# Patient Record
Sex: Female | Born: 1935
Health system: Southern US, Community
[De-identification: ages and names within clinical notes are randomized; demographics above are authoritative.]

## PROBLEM LIST (undated history)

## (undated) DIAGNOSIS — I639 Cerebral infarction, unspecified: Secondary | ICD-10-CM

## (undated) DIAGNOSIS — C541 Malignant neoplasm of endometrium: Secondary | ICD-10-CM

## (undated) DIAGNOSIS — R51 Headache: Secondary | ICD-10-CM

## (undated) DIAGNOSIS — I514 Myocarditis, unspecified: Secondary | ICD-10-CM

## (undated) DIAGNOSIS — F419 Anxiety disorder, unspecified: Secondary | ICD-10-CM

## (undated) DIAGNOSIS — E785 Hyperlipidemia, unspecified: Secondary | ICD-10-CM

## (undated) DIAGNOSIS — K219 Gastro-esophageal reflux disease without esophagitis: Secondary | ICD-10-CM

## (undated) DIAGNOSIS — I429 Cardiomyopathy, unspecified: Secondary | ICD-10-CM

## (undated) DIAGNOSIS — R519 Headache, unspecified: Secondary | ICD-10-CM

## (undated) DIAGNOSIS — J329 Chronic sinusitis, unspecified: Secondary | ICD-10-CM

## (undated) DIAGNOSIS — R002 Palpitations: Secondary | ICD-10-CM

## (undated) DIAGNOSIS — M1990 Unspecified osteoarthritis, unspecified site: Secondary | ICD-10-CM

## (undated) DIAGNOSIS — G459 Transient cerebral ischemic attack, unspecified: Secondary | ICD-10-CM

## (undated) DIAGNOSIS — I10 Essential (primary) hypertension: Secondary | ICD-10-CM

## (undated) DIAGNOSIS — Z923 Personal history of irradiation: Secondary | ICD-10-CM

## (undated) HISTORY — DX: Hyperlipidemia, unspecified: E78.5

## (undated) HISTORY — DX: Anxiety disorder, unspecified: F41.9

## (undated) HISTORY — DX: Chronic sinusitis, unspecified: J32.9

## (undated) HISTORY — DX: Myocarditis, unspecified: I51.4

## (undated) HISTORY — DX: Unspecified osteoarthritis, unspecified site: M19.90

## (undated) HISTORY — DX: Cardiomyopathy, unspecified: I42.9

## (undated) HISTORY — DX: Essential (primary) hypertension: I10

## (undated) HISTORY — DX: Palpitations: R00.2

## (undated) HISTORY — DX: Headache, unspecified: R51.9

## (undated) HISTORY — DX: Headache: R51

## (undated) HISTORY — DX: Transient cerebral ischemic attack, unspecified: G45.9

## (undated) HISTORY — DX: Gastro-esophageal reflux disease without esophagitis: K21.9

---

## 1963-12-25 HISTORY — PX: DILATION AND CURETTAGE OF UTERUS: SHX78

## 2004-12-24 HISTORY — PX: CARDIAC CATHETERIZATION: SHX172

## 2005-01-08 ENCOUNTER — Encounter: Payer: Self-pay | Admitting: Cardiology

## 2005-01-09 ENCOUNTER — Ambulatory Visit: Payer: Self-pay | Admitting: Cardiology

## 2005-01-09 ENCOUNTER — Inpatient Hospital Stay (HOSPITAL_COMMUNITY): Admission: AD | Admit: 2005-01-09 | Discharge: 2005-01-11 | Payer: Self-pay | Admitting: Cardiology

## 2005-01-10 ENCOUNTER — Encounter: Payer: Self-pay | Admitting: Cardiology

## 2005-01-18 ENCOUNTER — Ambulatory Visit: Payer: Self-pay | Admitting: Cardiology

## 2005-01-30 ENCOUNTER — Encounter: Payer: Self-pay | Admitting: Cardiology

## 2005-01-30 ENCOUNTER — Ambulatory Visit: Payer: Self-pay | Admitting: Cardiology

## 2005-01-31 ENCOUNTER — Encounter: Payer: Self-pay | Admitting: Cardiology

## 2005-02-28 ENCOUNTER — Ambulatory Visit: Payer: Self-pay | Admitting: Cardiology

## 2006-02-22 ENCOUNTER — Encounter: Payer: Self-pay | Admitting: Cardiology

## 2009-07-26 ENCOUNTER — Encounter: Payer: Self-pay | Admitting: Cardiology

## 2009-10-17 ENCOUNTER — Encounter: Payer: Self-pay | Admitting: Cardiology

## 2010-01-30 ENCOUNTER — Encounter: Payer: Self-pay | Admitting: Cardiology

## 2010-05-01 ENCOUNTER — Encounter: Payer: Self-pay | Admitting: Cardiology

## 2010-08-23 ENCOUNTER — Encounter: Payer: Self-pay | Admitting: Cardiology

## 2010-09-05 ENCOUNTER — Encounter: Payer: Self-pay | Admitting: Cardiology

## 2010-10-20 ENCOUNTER — Encounter: Payer: Self-pay | Admitting: Cardiology

## 2010-11-28 ENCOUNTER — Encounter: Payer: Self-pay | Admitting: Cardiology

## 2010-12-11 ENCOUNTER — Encounter: Payer: Self-pay | Admitting: Cardiology

## 2010-12-14 ENCOUNTER — Encounter: Payer: Self-pay | Admitting: Cardiology

## 2011-01-01 ENCOUNTER — Encounter: Payer: Self-pay | Admitting: Cardiology

## 2011-01-25 NOTE — Letter (Signed)
Summary: [facility] - OFFICE NOTE  [facility] - OFFICE NOTE   Imported By: Claudette Laws 01/02/2011 11:44:43  _____________________________________________________________________  External Attachment:    Type:   Image     Comment:   External Document

## 2011-01-25 NOTE — Letter (Signed)
Summary: [facility] - OFFICE NOTE  [facility] - OFFICE NOTE   Imported By: Claudette Laws 01/02/2011 11:44:16  _____________________________________________________________________  External Attachment:    Type:   Image     Comment:   External Document

## 2011-01-25 NOTE — Letter (Signed)
Summary: [facility] - OFFICE NOTE  [facility] - OFFICE NOTE   Imported By: Claudette Laws 01/02/2011 11:37:17  _____________________________________________________________________  External Attachment:    Type:   Image     Comment:   External Document

## 2011-01-25 NOTE — Letter (Signed)
Summary: [facility] - OFFICE NOTE  [facility] - OFFICE NOTE   Imported By: Claudette Laws 01/02/2011 11:41:25  _____________________________________________________________________  External Attachment:    Type:   Image     Comment:   External Document

## 2011-01-25 NOTE — Letter (Signed)
Summary: [facility] - OFFICE NOTE  [facility] - OFFICE NOTE   Imported By: Claudette Laws 01/02/2011 11:43:54  _____________________________________________________________________  External Attachment:    Type:   Image     Comment:   External Document

## 2011-01-25 NOTE — Letter (Signed)
Summary: [facility] - OFFICE NOTE  [facility] - OFFICE NOTE   Imported By: Claudette Laws 01/02/2011 11:39:53  _____________________________________________________________________  External Attachment:    Type:   Image     Comment:   External Document

## 2011-01-29 DIAGNOSIS — R002 Palpitations: Secondary | ICD-10-CM | POA: Insufficient documentation

## 2011-01-30 ENCOUNTER — Encounter: Payer: Self-pay | Admitting: Cardiology

## 2011-01-30 ENCOUNTER — Ambulatory Visit (INDEPENDENT_AMBULATORY_CARE_PROVIDER_SITE_OTHER): Payer: Medicare Other | Admitting: Cardiology

## 2011-01-30 ENCOUNTER — Ambulatory Visit: Payer: Self-pay | Admitting: Cardiology

## 2011-01-30 DIAGNOSIS — R9389 Abnormal findings on diagnostic imaging of other specified body structures: Secondary | ICD-10-CM | POA: Insufficient documentation

## 2011-01-30 DIAGNOSIS — E782 Mixed hyperlipidemia: Secondary | ICD-10-CM | POA: Insufficient documentation

## 2011-01-30 DIAGNOSIS — I10 Essential (primary) hypertension: Secondary | ICD-10-CM | POA: Insufficient documentation

## 2011-01-30 DIAGNOSIS — I4949 Other premature depolarization: Secondary | ICD-10-CM | POA: Insufficient documentation

## 2011-02-08 ENCOUNTER — Ambulatory Visit (HOSPITAL_COMMUNITY): Payer: Medicare Other | Attending: Internal Medicine

## 2011-02-08 DIAGNOSIS — I059 Rheumatic mitral valve disease, unspecified: Secondary | ICD-10-CM

## 2011-02-08 DIAGNOSIS — I080 Rheumatic disorders of both mitral and aortic valves: Secondary | ICD-10-CM | POA: Insufficient documentation

## 2011-02-08 DIAGNOSIS — R9389 Abnormal findings on diagnostic imaging of other specified body structures: Secondary | ICD-10-CM | POA: Insufficient documentation

## 2011-02-08 DIAGNOSIS — I079 Rheumatic tricuspid valve disease, unspecified: Secondary | ICD-10-CM | POA: Insufficient documentation

## 2011-02-08 DIAGNOSIS — I10 Essential (primary) hypertension: Secondary | ICD-10-CM | POA: Insufficient documentation

## 2011-02-08 DIAGNOSIS — E785 Hyperlipidemia, unspecified: Secondary | ICD-10-CM | POA: Insufficient documentation

## 2011-02-08 NOTE — Consult Note (Signed)
Summary: CARDIOLOGY CONSULT/ [facility]  [facility]ARDIOLOGY CONSULT/ [facility]   Imported By: Zachary George 01/29/2011 17:01:45  _____________________________________________________________________  External Attachment:    Type:   Image     Comment:   External Document

## 2011-02-08 NOTE — Consult Note (Signed)
Summary: CARDIOLOGY CONSULT/ [facility]  CARDIOLOGY CONSULT/ [facility]   Imported By: Zachary George 01/29/2011 17:01:18  _____________________________________________________________________  External Attachment:    Type:   Image     Comment:   External Document

## 2011-02-08 NOTE — Cardiovascular Report (Signed)
Summary: Cardiac Catheterization  Cardiac Catheterization   Imported By: Dorise Hiss 01/30/2011 08:28:52  _____________________________________________________________________  External Attachment:    Type:   Image     Comment:   External Document

## 2011-02-08 NOTE — Letter (Signed)
Summary: Discharge Summary  Discharge Summary   Imported By: Dorise Hiss 01/30/2011 08:31:11  _____________________________________________________________________  External Attachment:    Type:   Image     Comment:   External Document

## 2011-02-08 NOTE — Assessment & Plan Note (Signed)
Summary: NP-CARDIOMYOPATHY- JM   Visit Type:  Initial Consult Primary Provider:  Dr. Kirstie Peri   History of Present Illness: 75 year old woman presents for cardiology consultation. She was seen by our group several years ago with evaluation in 2006 demonstrating normal coronary arteries at catheterization with question of an episode of myocarditis. She also had PVCs documented around that time.  She is here to discuss a recent echocardiogram done at [facility] internal medicine, results detailedl below. This was done in the setting of palpitations. The patient's understanding is that the study revealed a "leaky valve."  Lab work from 03/05/2024shows glucose 93, BUN 9, creatinine 0.7, AST 29, ALT 16, cholesterol 152, triglycerides 119, HDL 46, LDL 82, sodium 139, potassium 4.4, TSH 1.4, hemoglobin 14.1, hematocrit 41.1, platelets 213.  Echocardiogram reported that the LV apex was not well seen with echodensity, question artifact or prominent muscle band, rule out mass per report. Unfortunately I do not have the images for review. Of note, echocardiogram also done through [facility] internal medicine back in August 2010 does not describe any LV apical abnormalities.  Cardionet monitor from December 2011, ordered by Dr. Sherryll Burger, is reported to have shown sinus rhythm with PVCs, atrial run, unfortunately no actual strips for me to review at this time.  Patient states that she walks on the treadmill "up to an hour" at a time at 2.9 miles an hour, no chest pain or unusual shortness of breath beyond NYHA class II.  Preventive Screening-Counseling & Management  Caffeine-Diet-Exercise     Does Patient Exercise: yes  Current Medications (verified): 1)  Citracal Calcium+d 600-40-500 Mg-Mg-Unit Xr24h-Tab (Calcium-Magnesium-Vitamin D) .... Take 1 Tablet By Mouth Once A Day 2)  Citracal Petites/vitamin D 200-250 Mg-Unit Tabs (Calcium Citrate-Vitamin D) .... Take 1 Tablet By Mouth Once A Day 3)  Complete  Tabs  (Multiple Vitamins-Minerals) .... Take 1 Tablet By Mouth Once A Day 4)  Bayer Aspirin 325 Mg Tabs (Aspirin) .... Take 1 Tablet By Mouth Once A Day 5)  Vitamin D3 400 Unit Tabs (Cholecalciferol) .... Take 1 Tablet By Mouth Once A Day 6)  Metoprolol Succinate 50 Mg Xr24h-Tab (Metoprolol Succinate) .... Take 1/2 Tablet By Mouth Once A Day 7)  Simvastatin 20 Mg Tabs (Simvastatin) .... Take 1 Tablet By Mouth Once A Day  Allergies (verified): 1)  ! Ace Inhibitors 2)  ! * Theodur  Comments:  Nurse/Medical Assistant: The patient's medication bottles and allergies were reviewed with the patient and were updated in the Medication and Allergy Lists.  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  Alcohol Use - no Drug Use - no Regular Exercise - yes Does Patient Exercise:  yes  Review of Systems  The patient denies anorexia, fever, chest pain, syncope, peripheral edema, prolonged cough, headaches, hemoptysis, melena, and hematochezia.  Occasional palpitations that feel like "skips" although no dizziness or syncope. Not progressive. Otherwise reviewed and negative except as outlined.  Vital Signs:  Patient profile:   75 year old  female Height:      63 inches Weight:      119 pounds BMI:     21.16 Pulse rate:   65 / minute BP sitting:   137 / 83  (left arm) Cuff size:   regular  Vitals Entered By: Carlye Grippe (January 30, 2011 10:26 AM)  Physical Exam  Additional Exam:  Thin woman in no acute distress. HEENT: Conjunctiva and lids normal, oropharynx with moist mucosa. Neck: Supple, no elevated JVP or bruits. No thyromegaly. Lungs: Clear to auscultation, nonlabored. Cardiac: Regular rate and rhythm, no S3, soft S4, no significant systolic murmur. Abdomen: Soft, nontender, bowel sounds present. Skin: Warm and dry. Musculoskeletal: No kyphosis. Extremities: No pitting edema. Pulses full. Neuropsychiatric: Alert and oriented x3, affect appropriate.    Carotid Doppler  Procedure date:  10/17/2009  Findings:      1-50% stenosis in the bilateral internal carotid arteries.  Echocardiogram  Procedure date:  12/11/2010  Findings:      [facility] internal medicine - [facility]  Normal LV chamber size with moderate LVH, apex not well visualized - echodensity noted, rule out prominent muscle banding, artifact, or mass. LVEF 60-70% with diastolic dysfunction. Mildly thickened mitral leaflets with mild mitral regurgitation, mild tricuspid regurgitation.  Cardiac Cath  Procedure date:  01/10/2005  Findings:      FINDINGS:  1.  LV: 145/11/21. EF 56% with focal akinesis of the mid inferior wall.  2.  No aortic stenosis or mitral regurgitation.  3.  Left main: Angiographically normal.  4.  LAD: Moderate sized vessel. It gives rise to a large septal perforator      which parallels the LAD itself throughout the mid portion of the vessel.      It is angiographically normal.  5.  Circumflex: Relatively small vessel giving rise to a single obtuse      marginal. It is angiographically normal.  6.  RCA: Large, dominant vessel. It is angiographically normal.  EKG  Procedure date:  01/30/2011  Findings:       Sinus rhythm at 68 beats per minute.  Impression & Recommendations:  Problem # 1:  ECHOCARDIOGRAM, ABNORMAL (ICD-793.2)  We reviewed the results of the patient's recent study, although I do not have the actual images. Recent echocardiogram done through [facility]  internal medicine reports difficult visualization of the LV apex with possibility of artifact versus prominent muscle band versus mass. LVEF is however normal, and previous studies did not describe any major abnormality. Patient is very concerned about this. Our plan is to proceed with a Definity contrast echocardiogram through the [place] office. If this study is normal, it is likely that artifact was noted, and no additional testing will be arranged. If on the other hand, we can always consider a cardiac MRI if contrast echocardiogram remains concerning. We also discussed the mild mitral and tricuspid regurgitation noted, likely of no major clinical significance at this time.  Problem # 2:  OTHER PREMATURE BEATS (ICD-427.69)  Documentation of PVCs in the past, and also by recent [facility]. Likely corresponds to palpitations. No history of dizziness or syncope. No clearly documented history of obstructive ischemic heart disease based on prior assessment.  Her updated medication list for this problem includes:    Bayer Aspirin 325 Mg Tabs (Aspirin) .Marland Kitchen... Take 1  tablet by mouth once a day    Metoprolol Succinate 50 Mg Xr24h-tab (Metoprolol succinate) .Marland Kitchen... Take 1/2 tablet by mouth once a day  Orders: 2-D Echocardiogram (2D Echo)  Problem # 3:  ESSENTIAL HYPERTENSION, BENIGN (ICD-401.1)  Followed by Dr. Sherryll Burger.  Her updated medication list for this problem includes:    Bayer Aspirin 325 Mg Tabs (Aspirin) .Marland Kitchen... Take 1 tablet by mouth once a day    Metoprolol Succinate 50 Mg Xr24h-tab (Metoprolol succinate) .Marland Kitchen... Take 1/2 tablet by mouth once a day  Problem # 4:  MIXED HYPERLIPIDEMIA (ICD-272.2)  Followed by Dr. Sherryll Burger. Suggest  aggressive LDL control in light of previously documented carotid atherosclerosis.  Her updated medication list for this problem includes:    Simvastatin 20 Mg Tabs (Simvastatin) .Marland Kitchen... Take 1 tablet by mouth once a day  Other Orders: EKG w/ Interpretation (93000)  Patient Instructions: 1)  Definity Contrast Echo in [place] 2)  Follow up - will call results if abnormal.

## 2011-02-09 ENCOUNTER — Encounter (INDEPENDENT_AMBULATORY_CARE_PROVIDER_SITE_OTHER): Payer: Self-pay | Admitting: *Deleted

## 2011-02-14 NOTE — Letter (Signed)
Summary: Engineer, materials at [facility]  [address]   [place], [place] [address]   Phone: [phone]  Fax: [phone]        February 09, 2011 MRN: [id number]    Pamela Bright [address] [place], [place]  [address]    Dear [name],  Your test ordered by Selena Batten has been reviewed by your physician (or physician assistant) and was found to be normal or stable. Your physician (or physician assistant) felt no changes were needed at this time.  _X___ Echocardiogram  ____ Cardiac Stress Test  ____ Lab Work  ____ Peripheral vascular study of arms, legs or neck  ____ CT scan or X-ray  ____ Lung or Breathing test  ____ Other:   Thank you.   Cyril Loosen, RN, BSN    Duane Boston, M.D., F.A.C.C. Thressa Sheller, M.D., F.A.C.C. Oneal Grout, M.D., F.A.C.C. Cheree Ditto, M.D., F.A.C.C. Daiva Nakayama, M.D., F.A.C.C. Kenney Houseman, M.D., F.A.C.C. Jeanne Ivan, PA-C

## 2011-03-22 ENCOUNTER — Telehealth: Payer: Self-pay | Admitting: *Deleted

## 2011-03-22 NOTE — Telephone Encounter (Signed)
Pt left message on voicemail stating she had an echo on 2/16 in [place]. She states they found something (unsure what pt was talking about). She wants to know if she should make any lifestyle changes.  Echo was reviewed by Dr. Diona Browner and pt was notified by letter that echo "looks good."   Left message for pt to call back on voicemail.

## 2011-03-22 NOTE — Telephone Encounter (Signed)
Pt states Dr. Sherryll Burger told her she has an aneurysm. She states she doesn't know what she should do as far as her normal activities. Pt notified Echo per Dr. Diona Browner "Looks good. There is prominent trabeculation at LV apex - essentially a normal variant. No definite mass or thrombus." Pt should discuss further w/Dr. Sherryll Burger as our report does not say anything about aneurysm. Pt verbalized understanding.

## 2012-07-17 ENCOUNTER — Encounter: Payer: Self-pay | Admitting: Cardiology

## 2013-10-24 HISTORY — PX: TUMOR REMOVAL: SHX12

## 2013-11-26 HISTORY — PX: TOTAL ABDOMINAL HYSTERECTOMY W/ BILATERAL SALPINGOOPHORECTOMY: SHX83

## 2013-11-26 HISTORY — PX: LYMPH NODE DISSECTION: SHX5087

## 2014-01-22 ENCOUNTER — Encounter: Payer: Self-pay | Admitting: Radiation Oncology

## 2014-01-22 NOTE — Progress Notes (Signed)
GYN Location of Tumor / Histology: endometrial adenocarcinoma  Patient presented with symptoms of: vaginal bleeding since 09/2013.  Biopsies revealed:      Past/Anticipated interventions by Gyn/Onc surgery, if GYJ:EHUDJ ABDOMINAL HYSTERECTOMY W/ BILATERAL SALPINGOOPHORECTOMY and lymph node dissection on 11/26/13 by Dr. Barrie Dunker   Past/Anticipated interventions by medical oncology, if any:   Weight changes, if any: weight stable  Bowel/Bladder complaints, if any: completed macrobid on Wednesday for UTI, reports occasional brown discharge, reports vaginal spotting for two weeks following hysterectomy but, denies any since, reports occasional brown discharge, denies vaginal itching.  Nausea/Vomiting, if any: denies  Pain issues, if any:  denies  SAFETY ISSUES:  Prior radiation? no  Pacemaker/ICD? no  Possible current pregnancy? no  Is the patient on methotrexate? no  Current Complaints / other details:  Denies dysuria or hematuria, denies night sweats, reports nocturia x4, denies constipation; here today for consultation with her son.

## 2014-01-25 ENCOUNTER — Telehealth: Payer: Self-pay | Admitting: *Deleted

## 2014-01-25 ENCOUNTER — Encounter: Payer: Self-pay | Admitting: Radiation Oncology

## 2014-01-25 ENCOUNTER — Ambulatory Visit
Admission: RE | Admit: 2014-01-25 | Discharge: 2014-01-25 | Disposition: A | Discharge status: Home / Self Care | Payer: Medicare Other | Source: Ambulatory Visit | Attending: Radiation Oncology | Admitting: Radiation Oncology

## 2014-01-25 VITALS — BP 137/47 | HR 69 | Temp 98.0°F | Resp 16 | Ht 63.0 in | Wt 120.5 lb

## 2014-01-25 DIAGNOSIS — Z9079 Acquired absence of other genital organ(s): Secondary | ICD-10-CM | POA: Insufficient documentation

## 2014-01-25 DIAGNOSIS — Z90710 Acquired absence of both cervix and uterus: Secondary | ICD-10-CM | POA: Insufficient documentation

## 2014-01-25 DIAGNOSIS — Z79899 Other long term (current) drug therapy: Secondary | ICD-10-CM | POA: Insufficient documentation

## 2014-01-25 DIAGNOSIS — E785 Hyperlipidemia, unspecified: Secondary | ICD-10-CM | POA: Insufficient documentation

## 2014-01-25 DIAGNOSIS — C541 Malignant neoplasm of endometrium: Secondary | ICD-10-CM

## 2014-01-25 DIAGNOSIS — Z7982 Long term (current) use of aspirin: Secondary | ICD-10-CM | POA: Insufficient documentation

## 2014-01-25 DIAGNOSIS — I428 Other cardiomyopathies: Secondary | ICD-10-CM | POA: Insufficient documentation

## 2014-01-25 DIAGNOSIS — I10 Essential (primary) hypertension: Secondary | ICD-10-CM | POA: Insufficient documentation

## 2014-01-25 DIAGNOSIS — Z8673 Personal history of transient ischemic attack (TIA), and cerebral infarction without residual deficits: Secondary | ICD-10-CM | POA: Insufficient documentation

## 2014-01-25 DIAGNOSIS — K219 Gastro-esophageal reflux disease without esophagitis: Secondary | ICD-10-CM | POA: Insufficient documentation

## 2014-01-25 DIAGNOSIS — C549 Malignant neoplasm of corpus uteri, unspecified: Secondary | ICD-10-CM | POA: Insufficient documentation

## 2014-01-25 HISTORY — DX: Malignant neoplasm of endometrium: C54.1

## 2014-01-25 HISTORY — DX: Cerebral infarction, unspecified: I63.9

## 2014-01-25 NOTE — Progress Notes (Signed)
Radiation Oncology         [phone] ________________________________  Initial outpatient Consultation  Name: LUANNA WEESNER MRN: [id number]  Date: 01/25/2014  DOB: Apr 01, 1936  BP:ZWCH,ENIDPO, MD  Gatha Mayer, MD Santo Held, MD  REFERRING PHYSICIAN: Gatha Mayer, MD  DIAGNOSIS: Endometrioid adenocarcinoma,  Stage pT1b, pN0, Mx    HISTORY OF PRESENT ILLNESS::Kammi D Woodford is a 78 y.o. female who is seen out of the courtesy of Dr. Orlene Erm for an opinion concerning radiation therapy as part of management of patient's stage I endometrioid adenocarcinoma. The patient presented late last year with vaginal bleeding. She was taken to  the operating room by Dr. Barrie Dunker and underwent total hysterectomy with bilateral salpingo-oophorectomy along with node dissection. Pathology was significant for endometrioid adenocarcinoma, grade 2 measuring 3 cm in greatest dimension. The depth of invasion was 10 mm with a total myometrial thickness of 13 mm. No LVI or cervical involvement was noted. Patient is done well since her surgery. She was seen by Dr. Orlene Erm in [place] and based on her stage it was recommended consideration for intracavitary brachytherapy treatments. Patient now presents for evaluation of  this treatment.Marland Kitchen  PREVIOUS RADIATION THERAPY: No  PAST MEDICAL HISTORY:  has a past medical history of GERD (gastroesophageal reflux disease); Cardiomyopathy; Generalized headaches; Palpitations; TIA (transient ischemic attack); Sinusitis; Anxiety; Hyperlipidemia; Hypertension; Myocarditis; CVA (cerebral infarction); Endometrial cancer; and Osteoarthritis.    PAST SURGICAL HISTORY: Past Surgical History  Procedure Laterality Date  . Total abdominal hysterectomy w/ bilateral salpingoophorectomy  11/26/13    by Dr. Barrie Dunker  . Lymph node dissection  11/26/13  . Cardiac catheterization  12/2004  . Dilation and curettage of uterus  1965    removal of polyps  . Tumor removal  10/2013    FAMILY  HISTORY: family history includes Cancer in her maternal aunt, maternal uncle, maternal uncle, mother, and another family member; Heart disease in her father and another family member.  SOCIAL HISTORY:  reports that she has never smoked. She does not have any smokeless tobacco history on file. She reports that she does not drink alcohol. she lives in [place], [place].  ALLERGIES: Ace inhibitors and Theophyllines  MEDICATIONS:  Current Outpatient Prescriptions  Medication Sig Dispense Refill  . aspirin 325 MG tablet Take 325 mg by mouth daily.      . Calcium Citrate (CITRACAL PO) Take by mouth.      . Cholecalciferol (VITAMIN D-3 PO) Take by mouth.      . metoprolol succinate (TOPROL-XL) 50 MG 24 hr tablet Take 25 mg by mouth daily. Take with or immediately following a meal.      . Multiple Vitamins-Minerals (MULTIVITAMIN PO) Take by mouth.      . Omega-3 Fatty Acids (FISH OIL BURP-LESS) 1200 MG CAPS Take by mouth.      . simvastatin (ZOCOR) 20 MG tablet Take 20 mg by mouth every evening.       No current facility-administered medications for this encounter.    REVIEW OF SYSTEMS:  A 15 point review of systems is documented in the electronic medical record. This was obtained by the nursing staff. However, I reviewed this with the patient to discuss relevant findings and make appropriate changes.  She denies any pelvic pain or urination difficulties. She has had some mild problems with constipation. She denies any flank pain or breathing problems.   PHYSICAL EXAM:  height is 5\' 3"  (1.6 m) and weight is 120 lb 8 oz (54.658 kg). Her oral  temperature is 98 F (36.7 C). Her blood pressure is 137/47 and her pulse is 69. Her respiration is 16 and oxygen saturation is 100%.   BP 137/47  Pulse 69  Temp(Src) 98 F (36.7 C) (Oral)  Resp 16  Ht 5\' 3"  (1.6 m)  Wt 120 lb 8 oz (54.658 kg)  BMI 21.35 kg/m2  SpO2 100%  General Appearance:    Alert, cooperative, no distress, appears stated age,  accompanied by her son on evaluation today   Head:    Normocephalic, without obvious abnormality, atraumatic  Eyes:    PERRL, conjunctiva/corneas clear, EOM's intact,      Ears:    Normal TM's and external ear canals, both ears  Nose:   Nares normal, septum midline, mucosa normal, no drainage    or sinus tenderness  Throat:   Lips, mucosa, and tongue normal; gums normal  Neck:   Supple, symmetrical, trachea midline, no adenopathy;    thyroid:  no enlargement/tenderness/nodules; no carotid   bruit or JVD  Back:     Symmetric, no curvature, ROM normal, no CVA tenderness  Lungs:     Clear to auscultation bilaterally, respirations unlabored  Chest Wall:    No tenderness or deformity   Heart:    Regular rate and rhythm, S1 and S2 normal, no murmur, rub   or gallop     Abdomen:     Soft, non-tender, bowel sounds active all four quadrants,    no masses, no organomegaly  Genitalia:    deferred until planning day      Extremities:   Extremities normal, atraumatic, no cyanosis or edema  Pulses:   2+ and symmetric all extremities  Skin:   Skin color, texture, turgor normal, no rashes or lesions  Lymph nodes:   Cervical, supraclavicular, and axillary nodes normal  Neurologic:    normal strength, sensation and reflexes    throughout    ECOG = 0   LABORATORY DATA:  No results found for this basename: WBC, HGB, HCT, MCV, PLT   No results found for this basename: NA, K, CL, CO2   No results found for this basename: ALT, AST, GGT, ALKPHOS, BILITOT     RADIOGRAPHY: No results found.    IMPRESSION: Endometrioid adenocarcinoma,  Stage pT1b, pN0, Mx. Patient would be a good candidate for intracavitary brachytherapy treatments to reduce the chances for vaginal cuff recurrence. I discussed the treatment course,  side effects and potential toxicities of radiation therapy in this situation with the patient and her son. She appears to understand and wishes to proceed with planned course of treatment. I  did discuss with the patient that given her age and clinical stage another option would be external beam radiation therapy but at this time the patient would prefer to proceed with intracavitary brachytherapy treatments.    PLAN: Simulation and planning in the near future. I anticipate 5 high-dose rate treatments directed at the vaginal cuff area using iridium 192 as the high-dose-rate source.  I spent 40 minutes minutes face to face with the patient and more than 50% of that time was spent in counseling and/or coordination of care.   ------------------------------------------------  -----------------------------------  Blair Promise, PhD, MD

## 2014-01-25 NOTE — Telephone Encounter (Signed)
CALLED PATIENT TO INFORM OF APPTS. FOR HDR CASE, LVM FOR A RETURN CALL

## 2014-01-25 NOTE — Progress Notes (Signed)
See progress note under physician encounter. 

## 2014-01-27 NOTE — Progress Notes (Signed)
GYN Location of Tumor / Histology: endometrial adenocarcinoma   Patient presented with symptoms of: vaginal bleeding since 09/2013.   Biopsies revealed:    Past/Anticipated interventions by Gyn/Onc surgery, if OVA:NVBTY ABDOMINAL HYSTERECTOMY W/ BILATERAL SALPINGOOPHORECTOMY and lymph node dissection on 11/26/13 by Dr. Barrie Dunker   Past/Anticipated interventions by medical oncology, if any: none  Weight changes, if any: weight stable   Bowel/Bladder complaints, if any: completed macrobid on Wednesday for UTI, reports occasional brown discharge, reports vaginal spotting for two weeks following hysterectomy but, denies any since, reports occasional brown discharge, denies vaginal itching.   Nausea/Vomiting, if any: denies   Pain issues, if any: denies   SAFETY ISSUES:  Prior radiation? no  Pacemaker/ICD? no  Possible current pregnancy? no  Is the patient on methotrexate? No  Current Complaints / other details:

## 2014-01-29 ENCOUNTER — Telehealth: Payer: Self-pay | Admitting: *Deleted

## 2014-01-29 NOTE — Telephone Encounter (Signed)
CALLED PATIENT TO REMIND OF APPTS. FOR 02-01-14, SPOKE WITH PATIENT AND SHE IS AWARE OF THESE APPTS.

## 2014-02-01 ENCOUNTER — Ambulatory Visit
Admission: RE | Admit: 2014-02-01 | Discharge: 2014-02-01 | Disposition: A | Discharge status: Home / Self Care | Payer: Medicare Other | Source: Ambulatory Visit | Attending: Radiation Oncology | Admitting: Radiation Oncology

## 2014-02-01 ENCOUNTER — Encounter: Payer: Self-pay | Admitting: Radiation Oncology

## 2014-02-01 VITALS — BP 147/78 | HR 68 | Temp 97.8°F | Ht 63.0 in | Wt 120.1 lb

## 2014-02-01 DIAGNOSIS — Z9079 Acquired absence of other genital organ(s): Secondary | ICD-10-CM | POA: Insufficient documentation

## 2014-02-01 DIAGNOSIS — Z8673 Personal history of transient ischemic attack (TIA), and cerebral infarction without residual deficits: Secondary | ICD-10-CM | POA: Insufficient documentation

## 2014-02-01 DIAGNOSIS — Z87440 Personal history of urinary (tract) infections: Secondary | ICD-10-CM | POA: Insufficient documentation

## 2014-02-01 DIAGNOSIS — Z79899 Other long term (current) drug therapy: Secondary | ICD-10-CM | POA: Insufficient documentation

## 2014-02-01 DIAGNOSIS — C549 Malignant neoplasm of corpus uteri, unspecified: Secondary | ICD-10-CM

## 2014-02-01 DIAGNOSIS — E785 Hyperlipidemia, unspecified: Secondary | ICD-10-CM | POA: Insufficient documentation

## 2014-02-01 DIAGNOSIS — I10 Essential (primary) hypertension: Secondary | ICD-10-CM | POA: Insufficient documentation

## 2014-02-01 DIAGNOSIS — Z809 Family history of malignant neoplasm, unspecified: Secondary | ICD-10-CM | POA: Insufficient documentation

## 2014-02-01 DIAGNOSIS — Z90710 Acquired absence of both cervix and uterus: Secondary | ICD-10-CM | POA: Insufficient documentation

## 2014-02-01 DIAGNOSIS — Z7982 Long term (current) use of aspirin: Secondary | ICD-10-CM | POA: Insufficient documentation

## 2014-02-01 DIAGNOSIS — K5900 Constipation, unspecified: Secondary | ICD-10-CM | POA: Insufficient documentation

## 2014-02-01 NOTE — Progress Notes (Signed)
   Department of Radiation Oncology  Phone:  [phone] Fax:        [phone]  Simulation note  Patient was taken to the CT simulation suite. Her previously constructed vaginal cylinder was placed in the proximal vagina. This was affixed to the CT/MR stabilization plate to prevent slippage. The patient then proceeded to undergo CT scan in the treatment position. The vaginal cylinder was noted to be in good position. Patient will undergo planning for her first high-dose-rate treatment. Point doses to the bladder and rectum will be obtained as well as to the cylinder surface.  Treatment plan  Patient will receive 5 treatments directed at the proximal vagina. Prescription will be 6 gray to the mucosal surface. Treatment length will be 3 cm.  5 treatments will be delivered with a cumulative brachytherapy dose of 30 gray.  Patient will be treated with iridium 192 as the high-dose-rate source.  -----------------------------------  Blair Promise, PhD, MD

## 2014-02-01 NOTE — Progress Notes (Signed)
   Department of Radiation Oncology  Phone:  [phone] Fax:        [phone]  Vaginal brachytherapy procedure note  The patient was taken to the nurse's station and placed in the dorsolithotomy position. A pelvic exam was performed which showed the vaginal cuff to be well healed. A scant amount of blood was noted at the vaginal cuff area.  The vaginal vault was somewhat narrowed but would permit a small speculum. Patient then underwent fitting for her vaginal cylinder. The optimal diameter for the patient's anatomy was a 2.5 cm diameter cylinder. This distended the vaginal vault without any significant air caps and did not cause any pain for the patient.  She noted some mild pressure with the insertion.  The patient tolerated the procedure well. She will be transferred to the planning room for a CT scan through the pelvis with the cylinder in place.   -----------------------------------  Blair Promise, PhD, MD      Simple treatment device note  The patient had construction of her custom vaginal cylinder today. The patient will be treated with two 2.5 cm diameter cylinders  placed proximally within the vagina. More distal the patient had three 2.0 cm cylinders placed.  -----------------------------------  Blair Promise, PhD, MD

## 2014-02-02 NOTE — Progress Notes (Signed)
  Radiation Oncology         [phone] ________________________________  Name: Pamela Bright MRN: [id number]  Date: 02/01/2014  DOB: 11-Feb-1936  Simulation Verification Note  Status: outpatient  NARRATIVE: The patient was brought to the HDR treatment unit and placed in the planned treatment position. The clinical setup was verified. Then port films were obtained (AP and Lateral) and uploaded to the radiation oncology medical record software.  The treatment beams were carefully compared against the planned radiation fields. The position location and shape of the radiation fields was reviewed.   Based on my personal review, I approved the simulation verification. The patient's treatment will proceed as planned.  -----------------------------------  Blair Promise, PhD, MD

## 2014-02-02 NOTE — Progress Notes (Signed)
   Department of Radiation Oncology  Phone:  [phone] Fax:        [phone]  High-dose-rate brachytherapy procedure note  After accurate placement of the vaginal cylinder was verified,  the remote afterloading device was affixed to the vaginal cylinder through catheter system. Patient then proceeded to undergo her first high-dose-rate treatment directed at the proximal vagina. Patient was treated with 1 channel using multiple dwell positions. Total treatment time was 196.3 seconds. Patient tolerated the procedure well. After completion of her therapy a radiation survey was performed documenting return of the iridium source into the Nucletron safe period  -----------------------------------  Blair Promise, PhD, MD

## 2014-02-05 ENCOUNTER — Telehealth: Payer: Self-pay | Admitting: *Deleted

## 2014-02-05 NOTE — Telephone Encounter (Signed)
CALLED PATIENT TO REMIND OF HDR [place] 02-08-14, SPOKE WITH PATIENT AND SHE IS AWARE OF THIS APPT.

## 2014-02-05 NOTE — Telephone Encounter (Signed)
xxxx 

## 2014-02-08 ENCOUNTER — Encounter: Payer: Self-pay | Admitting: Radiation Oncology

## 2014-02-08 ENCOUNTER — Ambulatory Visit
Admission: RE | Admit: 2014-02-08 | Discharge: 2014-02-08 | Disposition: A | Discharge status: Home / Self Care | Payer: Medicare Other | Source: Ambulatory Visit | Attending: Radiation Oncology | Admitting: Radiation Oncology

## 2014-02-08 DIAGNOSIS — C549 Malignant neoplasm of corpus uteri, unspecified: Secondary | ICD-10-CM

## 2014-02-08 NOTE — Progress Notes (Signed)
  Department of Radiation Oncology  Phone: [phone]  Fax: [phone]   High-dose-rate brachytherapy procedure note   After accurate placement of the vaginal cylinder was verified, the remote afterloading device was affixed to the vaginal cylinder through catheter system. Patient then proceeded to undergo her second high-dose-rate treatment directed at the proximal vagina. Patient was treated with 1 channel using multiple dwell positions. Total treatment time was 209.6 seconds. Patient tolerated the procedure well. After completion of her therapy a radiation survey was performed documenting return of the iridium source into the Nucletron safe period  -----------------------------------   Blair Promise, PhD, MD

## 2014-02-08 NOTE — Progress Notes (Signed)
  Department of Radiation Oncology  Phone: [phone]  Fax: [phone]    Vaginal brachytherapy procedure note   The patient was taken to the HDR suite and placed in the dorsolithotomy position. A pelvic exam was performed which showed the vaginal cuff to be well healed. The patient proceeded to have placement of her custom vaginal cylinder. The cylinder was then affixed to the CT/MR stabilization plate to prevent slippage. Patient tolerated the procedure well. -----------------------------------   Blair Promise, PhD, MD     Simple treatment device note   The patient had construction of her custom vaginal cylinder today. The patient will be treated with two 2.5 cm diameter cylinders placed proximally within the vagina. More distal the patient had three 2.0 cm cylinders placed.  -----------------------------------   Blair Promise, PhD, MD

## 2014-02-08 NOTE — Progress Notes (Signed)
  Radiation Oncology [phone] ________________________________   Name: Pamela Bright MRN: [id number]    Date: 02/08/2014  DOB: 1936-10-18   Simulation Verification Note   Status: outpatient   NARRATIVE: The patient was brought to the HDR treatment unit and placed in the planned treatment position. The clinical setup was verified. Then port films were obtained (AP and Lateral) and uploaded to the radiation oncology medical record software. The treatment beams were carefully compared against the planned radiation fields. The position location and shape of the radiation fields was reviewed. Based on my personal review, I approved the simulation verification. The patient's treatment will proceed as planned.  -----------------------------------   Blair Promise, PhD, MD

## 2014-02-12 ENCOUNTER — Telehealth: Payer: Self-pay | Admitting: Oncology

## 2014-02-12 ENCOUNTER — Telehealth: Payer: Self-pay | Admitting: *Deleted

## 2014-02-12 NOTE — Telephone Encounter (Signed)
Called patient to remind of HDR Treatment on Monday , Feb. 23, spoke with patient and she is aware of this appt.

## 2014-02-12 NOTE — Telephone Encounter (Signed)
Pamela Bright called and asked if she can take antibiotics for a sinus infection while having HDR treatment.  Advised her that it is OK.

## 2014-02-15 ENCOUNTER — Ambulatory Visit
Admission: RE | Admit: 2014-02-15 | Discharge: 2014-02-15 | Disposition: A | Discharge status: Home / Self Care | Payer: Medicare Other | Source: Ambulatory Visit | Attending: Radiation Oncology | Admitting: Radiation Oncology

## 2014-02-15 DIAGNOSIS — C549 Malignant neoplasm of corpus uteri, unspecified: Secondary | ICD-10-CM

## 2014-02-15 NOTE — Progress Notes (Signed)
  Department of Radiation Oncology   Phone: [phone]  Fax: [phone]    High-dose-rate brachytherapy procedure note   After accurate placement of the vaginal cylinder was verified, the remote afterloading device was affixed to the vaginal cylinder through catheter system. Patient then proceeded to undergo her third high-dose-rate treatment directed at the proximal vagina. Patient was treated with 1 channel using multiple dwell positions. Total treatment time was 224 seconds. Patient tolerated the procedure well. After completion of her therapy a radiation survey was performed documenting return of the iridium source into the Nucletron safe period  -----------------------------------   Blair Promise, PhD, MD

## 2014-02-15 NOTE — Progress Notes (Signed)
  Department of Radiation Oncology  Phone: [phone]  Fax: [phone]    Vaginal brachytherapy procedure note   The patient was taken to the HDR suite and placed in the dorsolithotomy position. A pelvic exam was performed which showed the vaginal cuff to be well healed. The patient proceeded to have placement of her custom vaginal cylinder. The cylinder was then affixed to the CT/MR stabilization plate to prevent slippage. Patient tolerated the procedure well. -----------------------------------   Janiece Scovill D. Ed Rayson, PhD, MD     Simple treatment device note   The patient had construction of her custom vaginal cylinder today. The patient will be treated with two 2.5 cm diameter cylinders placed proximally within the vagina. More distal the patient had three 2.0 cm cylinders placed.  -----------------------------------   Lakyra Tippins D. Jayanth Szczesniak, PhD, MD       

## 2014-02-15 NOTE — Progress Notes (Signed)
Simulation Verification Note  Status: outpatient   NARRATIVE: The patient was brought to the HDR treatment unit and placed in the planned treatment position. The clinical setup was verified. Then port films were obtained (AP and Lateral) and uploaded to the radiation oncology medical record software. The treatment beams were carefully compared against the planned radiation fields. The position location and shape of the radiation fields was reviewed. Based on my personal review, I approved the simulation verification. The patient's treatment will proceed as planned.  -----------------------------------   Blair Promise, PhD, MD

## 2014-02-22 ENCOUNTER — Telehealth: Payer: Self-pay | Admitting: *Deleted

## 2014-02-22 NOTE — Telephone Encounter (Signed)
CALLED PATIENT TO REMIND OF HDR [place] 02-23-14 @ 10 AM, LVM FOR A RETURN CALL

## 2014-02-23 ENCOUNTER — Ambulatory Visit
Admission: RE | Admit: 2014-02-23 | Discharge: 2014-02-23 | Disposition: A | Discharge status: Home / Self Care | Payer: Medicare Other | Source: Ambulatory Visit | Attending: Radiation Oncology | Admitting: Radiation Oncology

## 2014-02-23 DIAGNOSIS — C549 Malignant neoplasm of corpus uteri, unspecified: Secondary | ICD-10-CM

## 2014-02-23 NOTE — Progress Notes (Signed)
Simulation Verification Note   Status: outpatient   NARRATIVE: The patient was brought to the HDR treatment unit and placed in the planned treatment position. The clinical setup was verified. Then port films were obtained (AP and Lateral) and uploaded to the radiation oncology medical record software. The treatment beams were carefully compared against the planned radiation fields. The position location and shape of the radiation fields was reviewed. Based on my personal review, I approved the simulation verification. The patient's treatment will proceed as planned.  -----------------------------------   Blair Promise, PhD, MD    High-dose-rate brachytherapy procedure note    After accurate placement of the vaginal cylinder was verified, the remote afterloading device was affixed to the vaginal cylinder through catheter system. Patient then proceeded to undergo her fourth high-dose-rate treatment directed at the proximal vagina. Patient was treated with 1 channel using multiple dwell positions. Total treatment time was 241.3 seconds. Patient tolerated the procedure well. After completion of her therapy a radiation survey was performed documenting return of the iridium source into the Nucletron safe period  -----------------------------------   Blair Promise, PhD, MD

## 2014-03-01 ENCOUNTER — Telehealth: Payer: Self-pay | Admitting: *Deleted

## 2014-03-01 NOTE — Telephone Encounter (Signed)
CALLED PATIENT TO REMIND OF HDR [place]. FOR 03-02-14, LVM FOR A RETURN CALL

## 2014-03-02 ENCOUNTER — Ambulatory Visit
Admission: RE | Admit: 2014-03-02 | Discharge: 2014-03-02 | Disposition: A | Discharge status: Home / Self Care | Payer: Medicare Other | Source: Ambulatory Visit | Attending: Radiation Oncology | Admitting: Radiation Oncology

## 2014-03-02 DIAGNOSIS — C549 Malignant neoplasm of corpus uteri, unspecified: Secondary | ICD-10-CM

## 2014-03-02 NOTE — Progress Notes (Signed)
Simulation Verification Note   Status: outpatient   NARRATIVE: The patient was brought to the HDR treatment unit and placed in the planned treatment position. The clinical setup was verified. Then port films were obtained (AP and Lateral) and uploaded to the radiation oncology medical record software. The treatment beams were carefully compared against the planned radiation fields. The position location and shape of the radiation fields was reviewed. Based on my personal review, I approved the simulation verification. The patient's treatment will proceed as planned.  -----------------------------------   Blair Promise, PhD, MD   High-dose-rate brachytherapy procedure note   After accurate placement of the vaginal cylinder was verified, the remote afterloading device was affixed to the vaginal cylinder through catheter system. Patient then proceeded to undergo her fifth high-dose-rate treatment directed at the proximal vagina. Patient was treated with 1 channel using multiple dwell positions. Total treatment time was 257.8 seconds. Patient tolerated the procedure well. After completion of her therapy a radiation survey was performed documenting return of the iridium source into the Nucletron safe period  -----------------------------------   Blair Promise, PhD, MD

## 2014-03-03 ENCOUNTER — Telehealth: Payer: Self-pay | Admitting: *Deleted

## 2014-03-03 NOTE — Telephone Encounter (Signed)
Called patient to inform of fu appt. With Dr. Sondra Come in [place] on 04-05-14 @ 12:30 pm, spoke with patient and she is aware of this appt.

## 2014-04-08 ENCOUNTER — Telehealth: Payer: Self-pay | Admitting: Oncology

## 2014-04-08 ENCOUNTER — Telehealth: Payer: Self-pay | Admitting: *Deleted

## 2014-04-08 NOTE — Telephone Encounter (Signed)
Called patient to inform of fu appt. On 04-12-14 @ 9:30 am in [place] spoke with patient and she is aware of this appt.

## 2014-04-08 NOTE — Telephone Encounter (Signed)
Pamela Bright called and said she has started to use her size medium dilator.  She reports that Monday after she used it, she saw a little blood.  Yesterday when she used it she saw some blood and then this morning she noticed that the toilet water was red.  She said she has only seen pink discharge since.  Advised Dr. Sondra Come and she will be set up for a follow up appointment on Monday in [place].

## 2014-04-17 ENCOUNTER — Encounter: Payer: Self-pay | Admitting: Radiation Oncology

## 2014-04-17 NOTE — Progress Notes (Signed)
  Radiation Oncology         [phone] ________________________________  Name: ERISHA PAUGH MRN: [id number]  Date: 04/17/2014  DOB: Jan 07, 1936  End of Treatment Note  Diagnosis:     Endometrioid adenocarcinoma, Stage pT1b, pN0, Mx    Indication for treatment:  Postop to reduce the chances of vaginal recurrence       Radiation treatment dates:   February 9, February 16, February 23, March 3, March 10  Site/dose:   Proximal vagina, 3 CM treatment length, 30 gray in 5 fractions, 6 gray per fraction with prescription to the vaginal mucosal surface  Beams/energy:   Iridium 192 as the high-dose-rate source, a 2.5 cm diameter cylinder was used to deliver the treatment  Narrative: The patient tolerated radiation treatment relatively well.   She had minimal discomfort in the vulvo-vaginal area during the course for treatment. She did not experience any nausea or diarrhea.  Plan: The patient has completed radiation treatment. The patient will return to radiation oncology clinic for routine followup in one month. I advised them to call or return sooner if they have any questions or concerns related to their recovery or treatment.  -----------------------------------  Blair Promise, PhD, MD

## 2016-02-22 DIAGNOSIS — B078 Other viral warts: Secondary | ICD-10-CM | POA: Diagnosis not present

## 2016-02-22 DIAGNOSIS — L720 Epidermal cyst: Secondary | ICD-10-CM | POA: Diagnosis not present

## 2016-03-05 DIAGNOSIS — Z08 Encounter for follow-up examination after completed treatment for malignant neoplasm: Secondary | ICD-10-CM | POA: Diagnosis not present

## 2016-03-05 DIAGNOSIS — C541 Malignant neoplasm of endometrium: Secondary | ICD-10-CM | POA: Diagnosis not present

## 2016-03-05 DIAGNOSIS — Z8542 Personal history of malignant neoplasm of other parts of uterus: Secondary | ICD-10-CM | POA: Diagnosis not present

## 2016-04-19 DIAGNOSIS — J309 Allergic rhinitis, unspecified: Secondary | ICD-10-CM | POA: Diagnosis not present

## 2016-04-19 DIAGNOSIS — Z789 Other specified health status: Secondary | ICD-10-CM | POA: Diagnosis not present

## 2016-04-19 DIAGNOSIS — Z299 Encounter for prophylactic measures, unspecified: Secondary | ICD-10-CM | POA: Diagnosis not present

## 2016-07-06 DIAGNOSIS — E7800 Pure hypercholesterolemia, unspecified: Secondary | ICD-10-CM | POA: Diagnosis not present

## 2016-07-06 DIAGNOSIS — Z79899 Other long term (current) drug therapy: Secondary | ICD-10-CM | POA: Diagnosis not present

## 2016-07-06 DIAGNOSIS — Z1211 Encounter for screening for malignant neoplasm of colon: Secondary | ICD-10-CM | POA: Diagnosis not present

## 2016-07-06 DIAGNOSIS — Z7189 Other specified counseling: Secondary | ICD-10-CM | POA: Diagnosis not present

## 2016-07-06 DIAGNOSIS — R5383 Other fatigue: Secondary | ICD-10-CM | POA: Diagnosis not present

## 2016-07-06 DIAGNOSIS — Z299 Encounter for prophylactic measures, unspecified: Secondary | ICD-10-CM | POA: Diagnosis not present

## 2016-07-06 DIAGNOSIS — Z1389 Encounter for screening for other disorder: Secondary | ICD-10-CM | POA: Diagnosis not present

## 2016-07-06 DIAGNOSIS — Z6821 Body mass index (BMI) 21.0-21.9, adult: Secondary | ICD-10-CM | POA: Diagnosis not present

## 2016-07-06 DIAGNOSIS — Z0000 Encounter for general adult medical examination without abnormal findings: Secondary | ICD-10-CM | POA: Diagnosis not present

## 2016-07-06 DIAGNOSIS — E559 Vitamin D deficiency, unspecified: Secondary | ICD-10-CM | POA: Diagnosis not present

## 2016-07-09 DIAGNOSIS — Z08 Encounter for follow-up examination after completed treatment for malignant neoplasm: Secondary | ICD-10-CM | POA: Diagnosis not present

## 2016-07-09 DIAGNOSIS — C541 Malignant neoplasm of endometrium: Secondary | ICD-10-CM | POA: Diagnosis not present

## 2016-07-09 DIAGNOSIS — Z8542 Personal history of malignant neoplasm of other parts of uterus: Secondary | ICD-10-CM | POA: Diagnosis not present

## 2016-08-07 DIAGNOSIS — Z6821 Body mass index (BMI) 21.0-21.9, adult: Secondary | ICD-10-CM | POA: Diagnosis not present

## 2016-08-07 DIAGNOSIS — I10 Essential (primary) hypertension: Secondary | ICD-10-CM | POA: Diagnosis not present

## 2016-08-07 DIAGNOSIS — H6120 Impacted cerumen, unspecified ear: Secondary | ICD-10-CM | POA: Diagnosis not present

## 2016-10-01 DIAGNOSIS — Z23 Encounter for immunization: Secondary | ICD-10-CM | POA: Diagnosis not present

## 2016-10-09 DIAGNOSIS — I429 Cardiomyopathy, unspecified: Secondary | ICD-10-CM | POA: Diagnosis not present

## 2016-10-09 DIAGNOSIS — E7800 Pure hypercholesterolemia, unspecified: Secondary | ICD-10-CM | POA: Diagnosis not present

## 2016-10-09 DIAGNOSIS — Z299 Encounter for prophylactic measures, unspecified: Secondary | ICD-10-CM | POA: Diagnosis not present

## 2016-10-09 DIAGNOSIS — J309 Allergic rhinitis, unspecified: Secondary | ICD-10-CM | POA: Diagnosis not present

## 2016-10-09 DIAGNOSIS — Z6822 Body mass index (BMI) 22.0-22.9, adult: Secondary | ICD-10-CM | POA: Diagnosis not present

## 2016-10-23 DIAGNOSIS — J069 Acute upper respiratory infection, unspecified: Secondary | ICD-10-CM | POA: Diagnosis not present

## 2016-10-23 DIAGNOSIS — Z299 Encounter for prophylactic measures, unspecified: Secondary | ICD-10-CM | POA: Diagnosis not present

## 2016-11-08 DIAGNOSIS — L570 Actinic keratosis: Secondary | ICD-10-CM | POA: Diagnosis not present

## 2016-11-12 DIAGNOSIS — Z923 Personal history of irradiation: Secondary | ICD-10-CM | POA: Diagnosis not present

## 2016-11-12 DIAGNOSIS — R87625 Unsatisfactory cytologic smear of vagina: Secondary | ICD-10-CM | POA: Diagnosis not present

## 2016-11-12 DIAGNOSIS — C541 Malignant neoplasm of endometrium: Secondary | ICD-10-CM | POA: Diagnosis not present

## 2016-11-12 DIAGNOSIS — Z1272 Encounter for screening for malignant neoplasm of vagina: Secondary | ICD-10-CM | POA: Diagnosis not present

## 2016-11-12 DIAGNOSIS — Z90710 Acquired absence of both cervix and uterus: Secondary | ICD-10-CM | POA: Diagnosis not present

## 2016-11-19 DIAGNOSIS — C541 Malignant neoplasm of endometrium: Secondary | ICD-10-CM | POA: Diagnosis not present

## 2017-02-01 DIAGNOSIS — S93601D Unspecified sprain of right foot, subsequent encounter: Secondary | ICD-10-CM | POA: Diagnosis not present

## 2017-03-14 DIAGNOSIS — C541 Malignant neoplasm of endometrium: Secondary | ICD-10-CM | POA: Diagnosis not present

## 2017-03-14 DIAGNOSIS — Z299 Encounter for prophylactic measures, unspecified: Secondary | ICD-10-CM | POA: Diagnosis not present

## 2017-03-14 DIAGNOSIS — Z713 Dietary counseling and surveillance: Secondary | ICD-10-CM | POA: Diagnosis not present

## 2017-03-14 DIAGNOSIS — Z6822 Body mass index (BMI) 22.0-22.9, adult: Secondary | ICD-10-CM | POA: Diagnosis not present

## 2017-03-14 DIAGNOSIS — E7800 Pure hypercholesterolemia, unspecified: Secondary | ICD-10-CM | POA: Diagnosis not present

## 2017-03-14 DIAGNOSIS — I10 Essential (primary) hypertension: Secondary | ICD-10-CM | POA: Diagnosis not present

## 2017-03-14 DIAGNOSIS — Z789 Other specified health status: Secondary | ICD-10-CM | POA: Diagnosis not present

## 2017-03-14 DIAGNOSIS — I429 Cardiomyopathy, unspecified: Secondary | ICD-10-CM | POA: Diagnosis not present

## 2017-03-14 DIAGNOSIS — R202 Paresthesia of skin: Secondary | ICD-10-CM | POA: Diagnosis not present

## 2017-03-20 DIAGNOSIS — H6123 Impacted cerumen, bilateral: Secondary | ICD-10-CM | POA: Diagnosis not present

## 2017-03-20 DIAGNOSIS — J069 Acute upper respiratory infection, unspecified: Secondary | ICD-10-CM | POA: Diagnosis not present

## 2017-04-25 ENCOUNTER — Encounter: Payer: Self-pay | Admitting: Radiation Oncology

## 2017-04-29 ENCOUNTER — Ambulatory Visit
Admission: RE | Admit: 2017-04-29 | Discharge: 2017-04-29 | Disposition: A | Discharge status: Home / Self Care | Payer: Medicare Other | Source: Ambulatory Visit | Attending: Radiation Oncology | Admitting: Radiation Oncology

## 2017-04-29 ENCOUNTER — Encounter: Payer: Self-pay | Admitting: Oncology

## 2017-04-29 ENCOUNTER — Other Ambulatory Visit (HOSPITAL_COMMUNITY)
Admission: RE | Admit: 2017-04-29 | Discharge: 2017-04-29 | Disposition: A | Discharge status: Home / Self Care | Payer: Medicare Other | Source: Ambulatory Visit | Attending: Radiation Oncology | Admitting: Radiation Oncology

## 2017-04-29 DIAGNOSIS — R3915 Urgency of urination: Secondary | ICD-10-CM | POA: Diagnosis not present

## 2017-04-29 DIAGNOSIS — Z7982 Long term (current) use of aspirin: Secondary | ICD-10-CM | POA: Diagnosis not present

## 2017-04-29 DIAGNOSIS — Z08 Encounter for follow-up examination after completed treatment for malignant neoplasm: Secondary | ICD-10-CM | POA: Diagnosis not present

## 2017-04-29 DIAGNOSIS — Z888 Allergy status to other drugs, medicaments and biological substances status: Secondary | ICD-10-CM | POA: Insufficient documentation

## 2017-04-29 DIAGNOSIS — Z8542 Personal history of malignant neoplasm of other parts of uterus: Secondary | ICD-10-CM | POA: Diagnosis not present

## 2017-04-29 DIAGNOSIS — Z01411 Encounter for gynecological examination (general) (routine) with abnormal findings: Secondary | ICD-10-CM | POA: Insufficient documentation

## 2017-04-29 DIAGNOSIS — R351 Nocturia: Secondary | ICD-10-CM | POA: Diagnosis not present

## 2017-04-29 DIAGNOSIS — C549 Malignant neoplasm of corpus uteri, unspecified: Secondary | ICD-10-CM

## 2017-04-29 DIAGNOSIS — Z923 Personal history of irradiation: Secondary | ICD-10-CM | POA: Diagnosis not present

## 2017-04-29 DIAGNOSIS — Z79899 Other long term (current) drug therapy: Secondary | ICD-10-CM | POA: Insufficient documentation

## 2017-04-29 HISTORY — DX: Personal history of irradiation: Z92.3

## 2017-04-29 NOTE — Progress Notes (Signed)
  Radiation Oncology         [phone] ________________________________  Name: Pamela Bright MRN: [id number]  Date: 04/29/2017  DOB: [birth year]  Follow-Up Visit Note  CC: Monico Blitz, MD  Monico Blitz, MD    ICD-9-CM ICD-10-CM   1. Malignant neoplasm of corpus uteri, except isthmus ([place]) 182.0 C54.9 Cytology - PAP    Diagnosis: Endometrioid adenocarcinoma, Stage pT1b, pN0, Mx   Interval Since Last Radiation: 3 years and 2 months 02/01/14-03/02/14: 30 Gy to the proximal vagina in 5 fractions  Narrative:  The patient returns today for routine follow-up. She denies vaginal bleeding/discharge. She reports urinary urgency and nocturia x 4. She reports drinking 8 glasses of fluid per day. She denies bowel issues. She reports a good energy level. She was concerned her last physical exam in [place] was not thorough enough and wishes to follow-up here in [place].                           ALLERGIES:  is allergic to ace inhibitors and theophyllines.  Meds: Current Outpatient Prescriptions  Medication Sig Dispense Refill  . aspirin 325 MG tablet Take 325 mg by mouth daily.    . Calcium Citrate (CITRACAL PO) Take 250 mg by mouth once.     . Cholecalciferol (VITAMIN D-3 PO) Take by mouth.    . metoprolol succinate (TOPROL-XL) 50 MG 24 hr tablet Take 25 mg by mouth daily. Take with or immediately following a meal.    . Multiple Vitamins-Minerals (MULTIVITAMIN PO) Take by mouth.    . Omega-3 Fatty Acids (FISH OIL BURP-LESS) 1200 MG CAPS Take by mouth.    . simvastatin (ZOCOR) 20 MG tablet Take 20 mg by mouth every evening.     No current facility-administered medications for this encounter.     Physical Findings: The patient is in no acute distress. Patient is alert and oriented.  height is 5\' 3"  (1.6 m) and weight is 118 lb 12.8 oz (53.9 kg). Her oral temperature is 98.3 F (36.8 C). Her blood pressure is 157/70 (abnormal) and her pulse is 69. Her oxygen saturation is 98%. .  No  significant changes. Lungs are clear to auscultation bilaterally. Heart has regular rate and rhythm. No palpable cervical, supraclavicular, or axillary adenopathy. Abdomen soft, non-tender, normal bowel sounds.On pelvic examination the external genitalia were unremarkable. A speculum exam was performed. The patient has some radiation changes in the proximal vagina but there are no mucosal lesions noted in the vaginal vault. A Pap smear was obtained of the vaginal cuff. On bimanual and rectovaginal examination there were no pelvic masses appreciated.    Lab Findings: No results found for: WBC, HGB, HCT, MCV, PLT  Radiographic Findings: No results found.  Impression:  The patient is recovering from the effects of radiation. No evidence of recurrence on clinical exam, pap smear pending.  Plan:  Patient will follow-up in radiation oncology in 6 months.  ____________________________________   This document serves as a record of services personally performed by Gery Pray, MD. It was created on his behalf by Bethann Humble, a trained medical scribe. The creation of this record is based on the scribe's personal observations and the provider's statements to them. This document has been checked and approved by the attending provider.

## 2017-04-29 NOTE — Progress Notes (Signed)
Pamela Bright is here for follow up.  She denies having pain or vaginal bleeding/discharge. She reports having urinary urgency and nocturia x 4.  She reports she is drinking about 8 glasses of fluid per day.  She denies having any bowel issues.  She reports having a good energy level.  She is concerned that her last physical exam in [place] was not thorough enough.  BP (!) 157/70 (BP Location: Right Arm, Patient Position: Sitting)   Pulse 69   Temp 98.3 F (36.8 C) (Oral)   Ht 5\' 3"  (1.6 m)   Wt 118 lb 12.8 oz (53.9 kg)   SpO2 98%   BMI 21.04 kg/m    Wt Readings from Last 3 Encounters:  04/29/17 118 lb 12.8 oz (53.9 kg)  02/01/14 120 lb 1.6 oz (54.5 kg)  01/25/14 120 lb 8 oz (54.7 kg)

## 2017-05-02 DIAGNOSIS — H5203 Hypermetropia, bilateral: Secondary | ICD-10-CM | POA: Diagnosis not present

## 2017-05-02 DIAGNOSIS — H524 Presbyopia: Secondary | ICD-10-CM | POA: Diagnosis not present

## 2017-05-02 DIAGNOSIS — H25093 Other age-related incipient cataract, bilateral: Secondary | ICD-10-CM | POA: Diagnosis not present

## 2017-05-02 DIAGNOSIS — H52223 Regular astigmatism, bilateral: Secondary | ICD-10-CM | POA: Diagnosis not present

## 2017-05-02 DIAGNOSIS — H43813 Vitreous degeneration, bilateral: Secondary | ICD-10-CM | POA: Diagnosis not present

## 2017-05-02 LAB — CYTOLOGY - PAP: Diagnosis: NEGATIVE

## 2017-05-07 ENCOUNTER — Telehealth: Payer: Self-pay | Admitting: Oncology

## 2017-05-07 NOTE — Telephone Encounter (Signed)
Called patient and notified her of her good pap smear results.  Patient verbalized understanding and agreement.

## 2017-06-12 DIAGNOSIS — J019 Acute sinusitis, unspecified: Secondary | ICD-10-CM | POA: Diagnosis not present

## 2017-06-12 DIAGNOSIS — I10 Essential (primary) hypertension: Secondary | ICD-10-CM | POA: Diagnosis not present

## 2017-07-02 DIAGNOSIS — R350 Frequency of micturition: Secondary | ICD-10-CM | POA: Diagnosis not present

## 2017-07-02 DIAGNOSIS — Z6820 Body mass index (BMI) 20.0-20.9, adult: Secondary | ICD-10-CM | POA: Diagnosis not present

## 2017-07-02 DIAGNOSIS — Z7189 Other specified counseling: Secondary | ICD-10-CM | POA: Diagnosis not present

## 2017-07-02 DIAGNOSIS — Z1211 Encounter for screening for malignant neoplasm of colon: Secondary | ICD-10-CM | POA: Diagnosis not present

## 2017-07-02 DIAGNOSIS — I429 Cardiomyopathy, unspecified: Secondary | ICD-10-CM | POA: Diagnosis not present

## 2017-07-02 DIAGNOSIS — C541 Malignant neoplasm of endometrium: Secondary | ICD-10-CM | POA: Diagnosis not present

## 2017-07-02 DIAGNOSIS — Z79899 Other long term (current) drug therapy: Secondary | ICD-10-CM | POA: Diagnosis not present

## 2017-07-02 DIAGNOSIS — Z0000 Encounter for general adult medical examination without abnormal findings: Secondary | ICD-10-CM | POA: Diagnosis not present

## 2017-07-02 DIAGNOSIS — E7800 Pure hypercholesterolemia, unspecified: Secondary | ICD-10-CM | POA: Diagnosis not present

## 2017-07-02 DIAGNOSIS — E559 Vitamin D deficiency, unspecified: Secondary | ICD-10-CM | POA: Diagnosis not present

## 2017-07-02 DIAGNOSIS — Z1389 Encounter for screening for other disorder: Secondary | ICD-10-CM | POA: Diagnosis not present

## 2017-07-02 DIAGNOSIS — R5383 Other fatigue: Secondary | ICD-10-CM | POA: Diagnosis not present

## 2017-07-02 DIAGNOSIS — Z299 Encounter for prophylactic measures, unspecified: Secondary | ICD-10-CM | POA: Diagnosis not present

## 2017-07-30 DIAGNOSIS — E2839 Other primary ovarian failure: Secondary | ICD-10-CM | POA: Diagnosis not present

## 2017-09-03 DIAGNOSIS — C541 Malignant neoplasm of endometrium: Secondary | ICD-10-CM | POA: Diagnosis not present

## 2017-09-03 DIAGNOSIS — I10 Essential (primary) hypertension: Secondary | ICD-10-CM | POA: Diagnosis not present

## 2017-09-03 DIAGNOSIS — Z6820 Body mass index (BMI) 20.0-20.9, adult: Secondary | ICD-10-CM | POA: Diagnosis not present

## 2017-09-03 DIAGNOSIS — E7800 Pure hypercholesterolemia, unspecified: Secondary | ICD-10-CM | POA: Diagnosis not present

## 2017-09-03 DIAGNOSIS — Z789 Other specified health status: Secondary | ICD-10-CM | POA: Diagnosis not present

## 2017-09-03 DIAGNOSIS — I429 Cardiomyopathy, unspecified: Secondary | ICD-10-CM | POA: Diagnosis not present

## 2017-09-03 DIAGNOSIS — M8580 Other specified disorders of bone density and structure, unspecified site: Secondary | ICD-10-CM | POA: Diagnosis not present

## 2017-09-03 DIAGNOSIS — Z299 Encounter for prophylactic measures, unspecified: Secondary | ICD-10-CM | POA: Diagnosis not present

## 2017-09-03 DIAGNOSIS — R05 Cough: Secondary | ICD-10-CM | POA: Diagnosis not present

## 2017-09-03 DIAGNOSIS — Z713 Dietary counseling and surveillance: Secondary | ICD-10-CM | POA: Diagnosis not present

## 2017-09-26 ENCOUNTER — Telehealth: Payer: Self-pay | Admitting: Oncology

## 2017-09-26 DIAGNOSIS — Z23 Encounter for immunization: Secondary | ICD-10-CM | POA: Diagnosis not present

## 2017-09-26 NOTE — Telephone Encounter (Signed)
Patient called and said that she is loosing weight (now weighs 100 lbs and was 118 lbs at her last visit) and is having a lot of gas.  She would like to move her appointment up with Dr. Sondra Come.  Appointment offered for this afternoon but patient said she would not have transportation.  Appointment has been rescheduled for Monday, 09/30/17 at 3 pm.

## 2017-09-30 ENCOUNTER — Other Ambulatory Visit (HOSPITAL_COMMUNITY)
Admission: RE | Admit: 2017-09-30 | Discharge: 2017-09-30 | Disposition: A | Discharge status: Home / Self Care | Payer: Medicare Other | Source: Ambulatory Visit | Attending: Radiation Oncology | Admitting: Radiation Oncology

## 2017-09-30 ENCOUNTER — Ambulatory Visit
Admission: RE | Admit: 2017-09-30 | Discharge: 2017-09-30 | Disposition: A | Discharge status: Home / Self Care | Payer: Medicare Other | Source: Ambulatory Visit | Attending: Radiation Oncology | Admitting: Radiation Oncology

## 2017-09-30 VITALS — BP 146/61 | HR 70 | Temp 98.2°F | Ht 63.0 in | Wt 111.8 lb

## 2017-09-30 DIAGNOSIS — C549 Malignant neoplasm of corpus uteri, unspecified: Secondary | ICD-10-CM | POA: Diagnosis present

## 2017-09-30 DIAGNOSIS — Z8542 Personal history of malignant neoplasm of other parts of uterus: Secondary | ICD-10-CM | POA: Diagnosis not present

## 2017-09-30 DIAGNOSIS — Z79899 Other long term (current) drug therapy: Secondary | ICD-10-CM | POA: Insufficient documentation

## 2017-09-30 DIAGNOSIS — Z923 Personal history of irradiation: Secondary | ICD-10-CM | POA: Diagnosis not present

## 2017-09-30 DIAGNOSIS — Z08 Encounter for follow-up examination after completed treatment for malignant neoplasm: Secondary | ICD-10-CM | POA: Diagnosis not present

## 2017-09-30 DIAGNOSIS — R140 Abdominal distension (gaseous): Secondary | ICD-10-CM | POA: Diagnosis not present

## 2017-09-30 DIAGNOSIS — R634 Abnormal weight loss: Secondary | ICD-10-CM | POA: Diagnosis not present

## 2017-09-30 LAB — CBC WITH DIFFERENTIAL/PLATELET
BASO%: 1.3 % (ref 0.0–2.0)
BASOS ABS: 0.1 10*3/uL (ref 0.0–0.1)
EOS ABS: 0.1 10*3/uL (ref 0.0–0.5)
EOS%: 0.8 % (ref 0.0–7.0)
HEMATOCRIT: 39.3 % (ref 34.8–46.6)
HEMOGLOBIN: 13.2 g/dL (ref 11.6–15.9)
LYMPH%: 23.9 % (ref 14.0–49.7)
MCH: 29.9 pg (ref 25.1–34.0)
MCHC: 33.5 g/dL (ref 31.5–36.0)
MCV: 89.2 fL (ref 79.5–101.0)
MONO#: 0.6 10*3/uL (ref 0.1–0.9)
MONO%: 7.7 % (ref 0.0–14.0)
NEUT%: 66.3 % (ref 38.4–76.8)
NEUTROS ABS: 5.1 10*3/uL (ref 1.5–6.5)
PLATELETS: 229 10*3/uL (ref 145–400)
RBC: 4.41 10*6/uL (ref 3.70–5.45)
RDW: 12.4 % (ref 11.2–14.5)
WBC: 7.6 10*3/uL (ref 3.9–10.3)
lymph#: 1.8 10*3/uL (ref 0.9–3.3)

## 2017-09-30 LAB — COMPREHENSIVE METABOLIC PANEL
ALBUMIN: 3.6 g/dL (ref 3.5–5.0)
ALT: 14 U/L (ref 0–55)
ANION GAP: 6 meq/L (ref 3–11)
AST: 24 U/L (ref 5–34)
Alkaline Phosphatase: 88 U/L (ref 40–150)
BUN: 17.8 mg/dL (ref 7.0–26.0)
CALCIUM: 9.5 mg/dL (ref 8.4–10.4)
CHLORIDE: 100 meq/L (ref 98–109)
CO2: 27 meq/L (ref 22–29)
CREATININE: 0.8 mg/dL (ref 0.6–1.1)
EGFR: 71 mL/min/{1.73_m2} — AB (ref >90)
Glucose: 98 mg/dl (ref 70–140)
Potassium: 4.5 mEq/L (ref 3.5–5.1)
Sodium: 133 mEq/L — ABNORMAL LOW (ref 136–145)
TOTAL PROTEIN: 6.5 g/dL (ref 6.4–8.3)
Total Bilirubin: 0.41 mg/dL (ref 0.20–1.20)

## 2017-09-30 NOTE — Progress Notes (Signed)
Radiation Oncology         ([phone]) [phone] ________________________________  Name: Pamela Bright MRN: [id number]  Date: 09/30/2017  DOB: [birth year]     Follow-Up Visit Note  CC: Monico Blitz, MD  Gatha Mayer, MD    ICD-10-CM   1. Malignant neoplasm of corpus uteri, except isthmus ([place]) C54.9     Diagnosis: Endometrioid adenocarcinoma, Stage pT1b, pN0, Mx   Interval Since Last Radiation: 3 years and 7 months 02/01/14-03/02/14: 30 Gy to the proximal vagina in 5 fractions  Narrative:  The patient returns today for routine follow-up for radiation treatment completed 03/02/2014. She requested a follow-up sooner than scheduled in November. She is accompanied by her son today. She reports having occasional dull pain in her upper abdomen for the past 3-4 weeks. She reports having a lot of gas. She has been trying to limit her diet from foods that cause gas. She has lost 7 lbs since her last visit and said her scale at home is weighing at 102 lbs. She has been exercising more. States her appetite is good. She reports having occasional diarrhea. She mentioned she has been on antibiotics which she finished 2-3 weeks ago for a sinus infection. The bloating began after her last dose of antibiotic. She has been eating yogurt. She continues to report having urinary urgency and frequency. She said she gets up 4-5 times per night to urinate. She denies having any vaginal/rectal bleeding or discharge. She reports having less energy. She denies hematuria. She received a flu shot about one week ago.                        ALLERGIES:  is allergic to ace inhibitors and theophyllines.  Meds: Current Outpatient Prescriptions  Medication Sig Dispense Refill  . aspirin 325 MG tablet Take 325 mg by mouth daily.    . Calcium Citrate (CITRACAL PO) Take 250 mg by mouth once.     . Cholecalciferol (VITAMIN D-3 PO) Take by mouth.    . Loratadine (CLARITIN) 10 MG CAPS Take by mouth.    . metoprolol succinate  (TOPROL-XL) 50 MG 24 hr tablet Take 25 mg by mouth daily. Take with or immediately following a meal.    . Multiple Vitamins-Minerals (MULTIVITAMIN PO) Take by mouth.    . Omega-3 Fatty Acids (FISH OIL BURP-LESS) 1200 MG CAPS Take by mouth.    . simvastatin (ZOCOR) 20 MG tablet Take 20 mg by mouth every evening.     No current facility-administered medications for this encounter.     Physical Findings: The patient is in no acute distress. Patient is alert and oriented.  height is 5\' 3"  (1.6 m) and weight is 111 lb 12.8 oz (50.7 kg). Her oral temperature is 98.2 F (36.8 C). Her blood pressure is 146/61 (abnormal) and her pulse is 70. Her oxygen saturation is 98%. .   Lungs are clear to auscultation bilaterally. Heart has regular rate and rhythm. No palpable cervical, supraclavicular, or axillary adenopathy. Abdomen soft, non-tender, normal bowel sounds. Patient does have significant bloating in the lower abdominal region. No rebound or guarding. On pelvic examination the external genitalia were unremarkable. A speculum exam was performed. There are some radiation changes in the vaginal cuff area but no palpable or visible evidence of recurrence. A Pap smear was obtained of the vaginal cuff. On bimanual and rectovaginal examination there were no pelvic masses appreciated.    Lab Findings: No results found for: WBC,  HGB, HCT, MCV, PLT  Radiographic Findings: No results found.  Impression:  No evidence of recurrence on clinical exam today, pap smear pending. The patient's symptoms with unexplained weight loss and abdominal bloating are concerning for possible endometrial recurrence. She will be scheduled for labs and CT abdomen pelvis at [facility]. Routine 6 months follow up, otherwise.  Plan:  Labs will be drawn today at [facility] and CT Abdomen Pelvis at [facility] ordered. Routine 6 month follow-up, unless abnormal results then she will be scheduled for follow-up sooner.    -----------------------------------  Blair Promise, PhD, MD  This document serves as a record of services personally performed by Gery Pray, MD. It was created on his behalf by Arlyce Harman, a trained medical scribe. The creation of this record is based on the scribe's personal observations and the provider's statements to them. This document has been checked and approved by the attending provider.

## 2017-09-30 NOTE — Progress Notes (Addendum)
Pamela Bright is here for follow up.  She reports having occasional dull pain in her upper abdomen for the past 3-4 weeks.  She reports having a lot of gas.  She has been trying to limit her diet from foods that cause gas.  She has lost 7 lbs since her last visit and said her scale at home is weighing at 102 lbs.  She reports having occasional diarrhea.  She mentioned that she has been on antibiotcs which she finished 2-3 weeks ago for a sinus infection.  She continues to report having urinary urgency and frequency.  She said she gets up 4-5 times per night to urinate.  She denies having any vaginal/rectal bleeding or discharge.  She reports having less energy.  BP (!) 146/61 (BP Location: Right Arm, Patient Position: Sitting)   Pulse 70   Temp 98.2 F (36.8 C) (Oral)   Ht 5\' 3"  (1.6 m)   Wt 111 lb 12.8 oz (50.7 kg)   SpO2 98%   BMI 19.80 kg/m    Wt Readings from Last 3 Encounters:  09/30/17 111 lb 12.8 oz (50.7 kg)  04/29/17 118 lb 12.8 oz (53.9 kg)  02/01/14 120 lb 1.6 oz (54.5 kg)

## 2017-10-01 ENCOUNTER — Telehealth: Payer: Self-pay | Admitting: *Deleted

## 2017-10-01 NOTE — Telephone Encounter (Signed)
CALLED PATIENT TO INFORM OF CT ON 10-10-17 - ARRIVAL TIME - 2:45 PM FOR 3:00 PM CT@ [place], PT. TO BE NPO- 4 HRS. PRIOR TO TEST, AND FU WITH DR. KINARD ON 03-31-18 WITH DR. KINARD, SPOKE WITH PATIENT AND SHE IS AWARE OF THESE APPTS.

## 2017-10-02 LAB — CYTOLOGY - PAP: Diagnosis: NEGATIVE

## 2017-10-03 ENCOUNTER — Telehealth: Payer: Self-pay | Admitting: Oncology

## 2017-10-03 NOTE — Telephone Encounter (Signed)
Called patient and notified her of the good results on her pap smear per Dr. Sondra Come.  She verbalized understanding and did not have any further questions.

## 2017-10-10 ENCOUNTER — Ambulatory Visit (HOSPITAL_COMMUNITY)
Admission: RE | Admit: 2017-10-10 | Discharge: 2017-10-10 | Disposition: A | Discharge status: Home / Self Care | Payer: Medicare Other | Source: Ambulatory Visit | Attending: Radiation Oncology | Admitting: Radiation Oncology

## 2017-10-10 DIAGNOSIS — C541 Malignant neoplasm of endometrium: Secondary | ICD-10-CM | POA: Diagnosis not present

## 2017-10-10 DIAGNOSIS — Z90710 Acquired absence of both cervix and uterus: Secondary | ICD-10-CM | POA: Insufficient documentation

## 2017-10-10 DIAGNOSIS — R911 Solitary pulmonary nodule: Secondary | ICD-10-CM | POA: Insufficient documentation

## 2017-10-10 DIAGNOSIS — R634 Abnormal weight loss: Secondary | ICD-10-CM | POA: Diagnosis not present

## 2017-10-10 MED ORDER — IOPAMIDOL (ISOVUE-300) INJECTION 61%
100.0000 mL | Freq: Once | INTRAVENOUS | Status: AC | PRN
  Administered 2017-10-10: 100 mL via INTRAVENOUS

## 2017-10-17 ENCOUNTER — Telehealth: Payer: Self-pay | Admitting: Oncology

## 2017-10-17 NOTE — Telephone Encounter (Signed)
Patient called and asked for results of her recent CT scan.  Advised her of the results per Dr. Lisbeth Renshaw.  She verbalized understanding and did not have any questions.

## 2017-10-31 ENCOUNTER — Ambulatory Visit: Payer: Medicare Other | Admitting: Radiation Oncology

## 2017-11-01 DIAGNOSIS — Z6820 Body mass index (BMI) 20.0-20.9, adult: Secondary | ICD-10-CM | POA: Diagnosis not present

## 2017-11-01 DIAGNOSIS — I10 Essential (primary) hypertension: Secondary | ICD-10-CM | POA: Diagnosis not present

## 2017-11-01 DIAGNOSIS — Z299 Encounter for prophylactic measures, unspecified: Secondary | ICD-10-CM | POA: Diagnosis not present

## 2017-11-01 DIAGNOSIS — I429 Cardiomyopathy, unspecified: Secondary | ICD-10-CM | POA: Diagnosis not present

## 2017-11-01 DIAGNOSIS — C541 Malignant neoplasm of endometrium: Secondary | ICD-10-CM | POA: Diagnosis not present

## 2017-11-01 DIAGNOSIS — R911 Solitary pulmonary nodule: Secondary | ICD-10-CM | POA: Diagnosis not present

## 2018-02-26 DIAGNOSIS — R51 Headache: Secondary | ICD-10-CM | POA: Diagnosis not present

## 2018-02-26 DIAGNOSIS — R410 Disorientation, unspecified: Secondary | ICD-10-CM | POA: Diagnosis not present

## 2018-02-26 DIAGNOSIS — C541 Malignant neoplasm of endometrium: Secondary | ICD-10-CM | POA: Diagnosis not present

## 2018-02-26 DIAGNOSIS — R42 Dizziness and giddiness: Secondary | ICD-10-CM | POA: Diagnosis not present

## 2018-02-26 DIAGNOSIS — Z299 Encounter for prophylactic measures, unspecified: Secondary | ICD-10-CM | POA: Diagnosis not present

## 2018-02-26 DIAGNOSIS — Z789 Other specified health status: Secondary | ICD-10-CM | POA: Diagnosis not present

## 2018-02-26 DIAGNOSIS — I429 Cardiomyopathy, unspecified: Secondary | ICD-10-CM | POA: Diagnosis not present

## 2018-03-14 DIAGNOSIS — Z789 Other specified health status: Secondary | ICD-10-CM | POA: Diagnosis not present

## 2018-03-14 DIAGNOSIS — I700 Atherosclerosis of aorta: Secondary | ICD-10-CM | POA: Diagnosis not present

## 2018-03-14 DIAGNOSIS — I10 Essential (primary) hypertension: Secondary | ICD-10-CM | POA: Diagnosis not present

## 2018-03-14 DIAGNOSIS — C541 Malignant neoplasm of endometrium: Secondary | ICD-10-CM | POA: Diagnosis not present

## 2018-03-14 DIAGNOSIS — I429 Cardiomyopathy, unspecified: Secondary | ICD-10-CM | POA: Diagnosis not present

## 2018-03-14 DIAGNOSIS — E7800 Pure hypercholesterolemia, unspecified: Secondary | ICD-10-CM | POA: Diagnosis not present

## 2018-03-14 DIAGNOSIS — J069 Acute upper respiratory infection, unspecified: Secondary | ICD-10-CM | POA: Diagnosis not present

## 2018-03-14 DIAGNOSIS — Z681 Body mass index (BMI) 19 or less, adult: Secondary | ICD-10-CM | POA: Diagnosis not present

## 2018-03-14 DIAGNOSIS — R05 Cough: Secondary | ICD-10-CM | POA: Diagnosis not present

## 2018-03-14 DIAGNOSIS — Z299 Encounter for prophylactic measures, unspecified: Secondary | ICD-10-CM | POA: Diagnosis not present

## 2018-03-31 ENCOUNTER — Encounter: Payer: Self-pay | Admitting: Oncology

## 2018-03-31 ENCOUNTER — Encounter: Payer: Self-pay | Admitting: Radiation Oncology

## 2018-03-31 ENCOUNTER — Ambulatory Visit
Admission: RE | Admit: 2018-03-31 | Discharge: 2018-03-31 | Disposition: A | Discharge status: Home / Self Care | Payer: Medicare Other | Source: Ambulatory Visit | Attending: Radiation Oncology | Admitting: Radiation Oncology

## 2018-03-31 ENCOUNTER — Other Ambulatory Visit: Payer: Self-pay

## 2018-03-31 VITALS — BP 147/59 | HR 97 | Temp 98.6°F | Resp 20 | Wt 109.2 lb

## 2018-03-31 DIAGNOSIS — Z79899 Other long term (current) drug therapy: Secondary | ICD-10-CM | POA: Insufficient documentation

## 2018-03-31 DIAGNOSIS — R918 Other nonspecific abnormal finding of lung field: Secondary | ICD-10-CM | POA: Insufficient documentation

## 2018-03-31 DIAGNOSIS — R911 Solitary pulmonary nodule: Secondary | ICD-10-CM | POA: Diagnosis not present

## 2018-03-31 DIAGNOSIS — Z923 Personal history of irradiation: Secondary | ICD-10-CM | POA: Insufficient documentation

## 2018-03-31 DIAGNOSIS — Z7982 Long term (current) use of aspirin: Secondary | ICD-10-CM | POA: Diagnosis not present

## 2018-03-31 DIAGNOSIS — Z8542 Personal history of malignant neoplasm of other parts of uterus: Secondary | ICD-10-CM | POA: Diagnosis not present

## 2018-03-31 DIAGNOSIS — Z08 Encounter for follow-up examination after completed treatment for malignant neoplasm: Secondary | ICD-10-CM | POA: Diagnosis not present

## 2018-03-31 DIAGNOSIS — C549 Malignant neoplasm of corpus uteri, unspecified: Secondary | ICD-10-CM

## 2018-03-31 DIAGNOSIS — Z888 Allergy status to other drugs, medicaments and biological substances status: Secondary | ICD-10-CM | POA: Diagnosis not present

## 2018-03-31 DIAGNOSIS — Z8541 Personal history of malignant neoplasm of cervix uteri: Secondary | ICD-10-CM | POA: Diagnosis not present

## 2018-03-31 NOTE — Progress Notes (Signed)
Radiation Oncology         (336) [phone] ________________________________  Name: Pamela Bright MRN: [id number]  Date: 03/31/2018  DOB: January 05, 1936     Follow-Up Visit Note  CC: Monico Blitz, MD  Gatha Mayer, MD    ICD-10-CM   1. Malignant neoplasm of corpus uteri, except isthmus ([place]) C54.9     Diagnosis: Endometrioid adenocarcinoma, Stage pT1b, pN0, Mx   Interval Since Last Radiation: 4 years 1 month 02/01/14-03/02/14: 30 Gy to the proximal vagina in 5 fractions  Narrative:  The patient returns today for routine follow-up for radiation treatment completed 03/02/2014. She reports mild fatigue. She also notes nocturia x 4-5. She denies pain, diarrhea, nausea/vomting, dysuria, vaginal/rectal bleeding, vaginal discharge, or skin irritation. She reports using her dilator 2-3 times per week.         CT of the abdomen and pelvis on 10/10/18 showed a nodule within the right lower lobe favoring a benign nodule. No evidence of metastatic disease noted in the abdomen or pelvis.                ALLERGIES:  is allergic to ace inhibitors and theophyllines.  Meds: Current Outpatient Medications  Medication Sig Dispense Refill  . aspirin 325 MG tablet Take 325 mg by mouth daily.    . Calcium Citrate (CITRACAL PO) Take 250 mg by mouth once.     . Cholecalciferol (VITAMIN D-3 PO) Take by mouth.    . Loratadine (CLARITIN) 10 MG CAPS Take by mouth.    . metoprolol succinate (TOPROL-XL) 50 MG 24 hr tablet Take 25 mg by mouth daily. Take with or immediately following a meal.    . Multiple Vitamins-Minerals (MULTIVITAMIN PO) Take by mouth.    . Omega-3 Fatty Acids (FISH OIL BURP-LESS) 1200 MG CAPS Take by mouth.    . simvastatin (ZOCOR) 20 MG tablet Take 20 mg by mouth every evening.     No current facility-administered medications for this encounter.     Physical Findings: The patient is in no acute distress. Patient is alert and oriented.  weight is 109 lb 4 oz (49.6 kg). Her oral  temperature is 98.6 F (37 C). Her blood pressure is 147/59 (abnormal) and her pulse is 97. Her respiration is 20 and oxygen saturation is 97%. .   Lungs are clear to auscultation bilaterally. Heart has regular rate and rhythm. No palpable cervical, supraclavicular, or axillary adenopathy. Abdomen soft, non-tender, normal bowel sounds.  On pelvic examination the external genitalia were unremarkable. A speculum exam was performed. There are some radiation changes in the vaginal cuff area but no palpable or visible evidence of recurrence. On bimanual and rectovaginal examination there were no pelvic masses appreciated.    Lab Findings: Lab Results  Component Value Date   WBC 7.6 09/30/2017   HGB 13.2 09/30/2017   HCT 39.3 09/30/2017   MCV 89.2 09/30/2017   PLT 229 09/30/2017    Radiographic Findings: No results found.  Impression:  No evidence of recurrence on clinical exam today.  Plan:  Follow-up in radiation oncology in 6 months. Advised patient the clinical recommendation requires the use of the vaginal dilator for 2-3 years and as she is 4 years out from treatment she is now able to cease using her dilator. The patient's scan to the abdomen and pelvis showed a probable benign nodule in the right lower lobe recommending CT scan for further evaluation. We will get this set up at [facility] in [place] in approximately  2 weeks.  -----------------------------------  Blair Promise, PhD, MD  This document serves as a record of services personally performed by Gery Pray, MD. It was created on his behalf by Bethann Humble, a trained medical scribe. The creation of this record is based on the scribe's personal observations and the provider's statements to them. This document has been checked and approved by the attending provider.

## 2018-03-31 NOTE — Progress Notes (Signed)
Pamela Bright is here today for here follow-up appoint. Denies any pain. States that she has mild fatigue.Denies any diarrhea. Denies any nausea or vomiting. Denies any dysuria. Reports nocturia x 4-5.Denies any vaginal or rectal bleeding. Denies any vaginal discharge.Denies any skin irritation. States that she is using her dilator 2-3 times per week.  Vitals:   03/31/18 1503  BP: (!) 147/59  Pulse: 97  Resp: 20  Temp: 98.6 F (37 C)  TempSrc: Oral  SpO2: 97%  Weight: 109 lb 4 oz (49.6 kg)   Wt Readings from Last 3 Encounters:  03/31/18 109 lb 4 oz (49.6 kg)  09/30/17 111 lb 12.8 oz (50.7 kg)  04/29/17 118 lb 12.8 oz (53.9 kg)

## 2018-04-02 ENCOUNTER — Telehealth: Payer: Self-pay | Admitting: *Deleted

## 2018-04-02 NOTE — Telephone Encounter (Signed)
Called patient to inform of CT on 04-11-18 - arrival time - 1:45 pm @ [facility] Radiology, no restrictions to test, lvm for a return call

## 2018-04-11 ENCOUNTER — Ambulatory Visit (HOSPITAL_COMMUNITY): Payer: Medicare Other

## 2018-04-21 ENCOUNTER — Ambulatory Visit (HOSPITAL_COMMUNITY)
Admission: RE | Admit: 2018-04-21 | Discharge: 2018-04-21 | Disposition: A | Discharge status: Home / Self Care | Payer: Medicare Other | Source: Ambulatory Visit | Attending: Radiation Oncology | Admitting: Radiation Oncology

## 2018-04-21 DIAGNOSIS — R918 Other nonspecific abnormal finding of lung field: Secondary | ICD-10-CM | POA: Diagnosis not present

## 2018-04-21 DIAGNOSIS — I700 Atherosclerosis of aorta: Secondary | ICD-10-CM | POA: Diagnosis not present

## 2018-04-21 DIAGNOSIS — R911 Solitary pulmonary nodule: Secondary | ICD-10-CM | POA: Diagnosis not present

## 2018-04-21 DIAGNOSIS — I2510 Atherosclerotic heart disease of native coronary artery without angina pectoris: Secondary | ICD-10-CM | POA: Insufficient documentation

## 2018-04-25 DIAGNOSIS — Z1231 Encounter for screening mammogram for malignant neoplasm of breast: Secondary | ICD-10-CM | POA: Diagnosis not present

## 2018-06-10 DIAGNOSIS — H524 Presbyopia: Secondary | ICD-10-CM | POA: Diagnosis not present

## 2018-06-10 DIAGNOSIS — H52223 Regular astigmatism, bilateral: Secondary | ICD-10-CM | POA: Diagnosis not present

## 2018-06-10 DIAGNOSIS — H25093 Other age-related incipient cataract, bilateral: Secondary | ICD-10-CM | POA: Diagnosis not present

## 2018-06-10 DIAGNOSIS — H5203 Hypermetropia, bilateral: Secondary | ICD-10-CM | POA: Diagnosis not present

## 2018-06-10 DIAGNOSIS — H43813 Vitreous degeneration, bilateral: Secondary | ICD-10-CM | POA: Diagnosis not present

## 2018-06-14 DIAGNOSIS — J019 Acute sinusitis, unspecified: Secondary | ICD-10-CM | POA: Diagnosis not present

## 2018-07-09 DIAGNOSIS — I10 Essential (primary) hypertension: Secondary | ICD-10-CM | POA: Diagnosis not present

## 2018-07-09 DIAGNOSIS — Z299 Encounter for prophylactic measures, unspecified: Secondary | ICD-10-CM | POA: Diagnosis not present

## 2018-07-09 DIAGNOSIS — Z6820 Body mass index (BMI) 20.0-20.9, adult: Secondary | ICD-10-CM | POA: Diagnosis not present

## 2018-07-09 DIAGNOSIS — J329 Chronic sinusitis, unspecified: Secondary | ICD-10-CM | POA: Diagnosis not present

## 2018-07-09 DIAGNOSIS — Z789 Other specified health status: Secondary | ICD-10-CM | POA: Diagnosis not present

## 2018-09-15 DIAGNOSIS — Z23 Encounter for immunization: Secondary | ICD-10-CM | POA: Diagnosis not present

## 2018-09-15 IMAGING — CT CT CHEST W/O CM
2 of 3 series · 15 of 36 positions shown, 18 images · non-contrast
Comparison: 10/10/2017 CT abdomen/pelvis. 03/14/2018 chest
radiograph.

CLINICAL DATA: History of endometrioid adenocarcinoma status post
TAHBSO in 8400. follow-up pulmonary nodule.

EXAM:
CT CHEST WITHOUT CONTRAST
TECHNIQUE: Multidetector CT imaging of the chest was performed following the
standard protocol without IV contrast.

[Series 2: thorax · axial · 0.58mm/px · z∈[-224,+20]mm · 12 of 144 slices shown, 15 images]
[im 11/144  mediastinal]
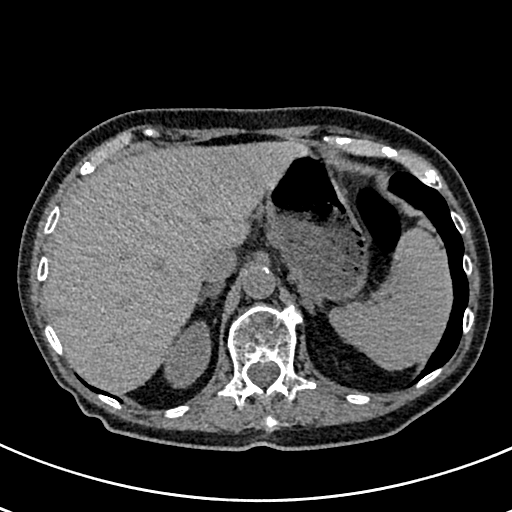
[im 11/144  lung]
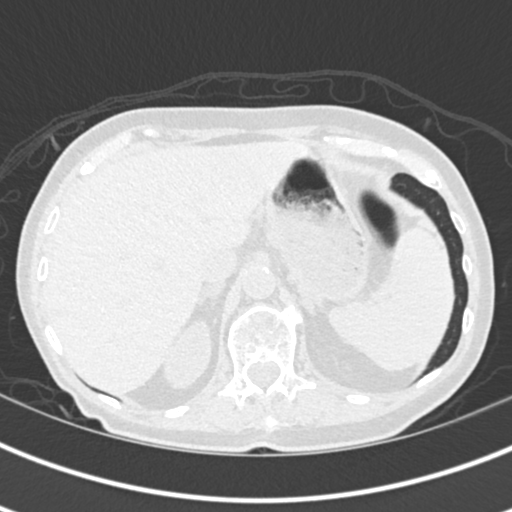
[im 22/144  lung]
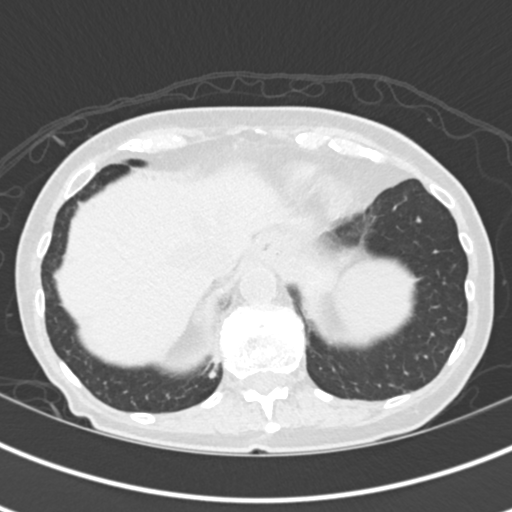
[im 32/144  lung]
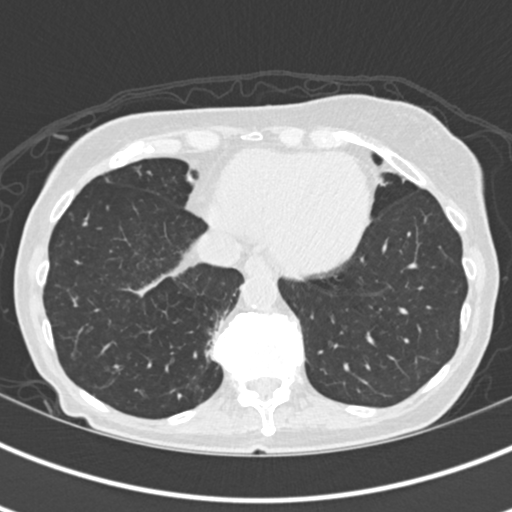
[im 43/144  lung]
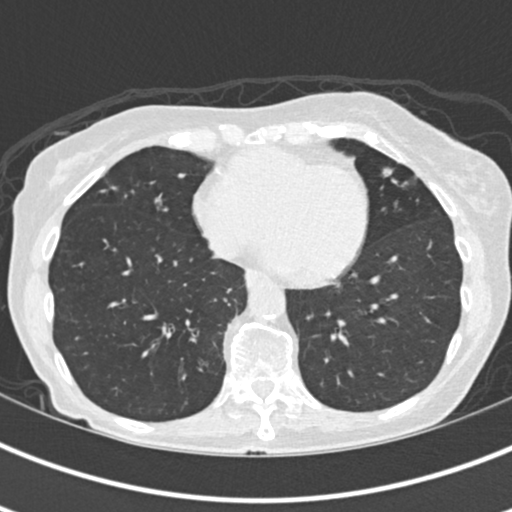
[im 53/144  mediastinal]
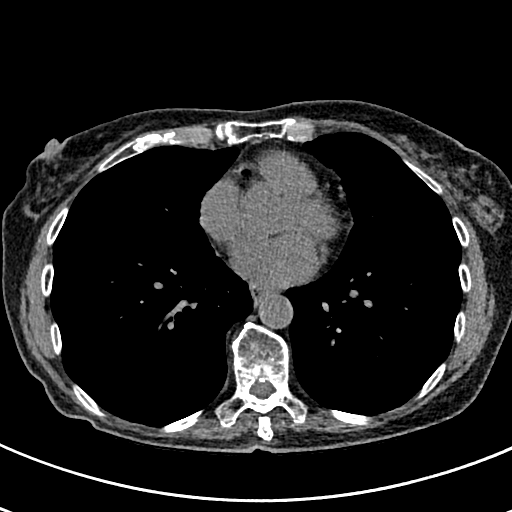
[im 53/144  lung]
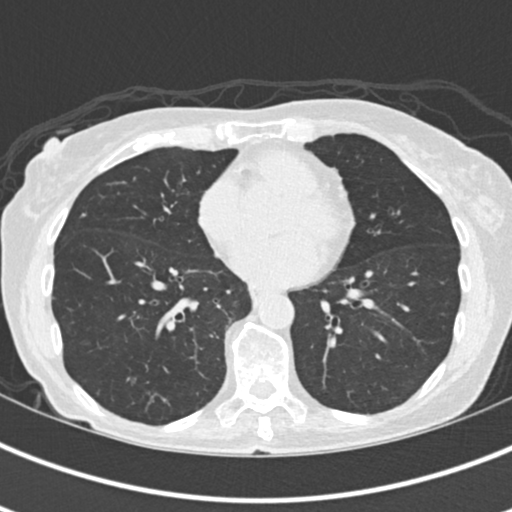
[im 64/144  lung]
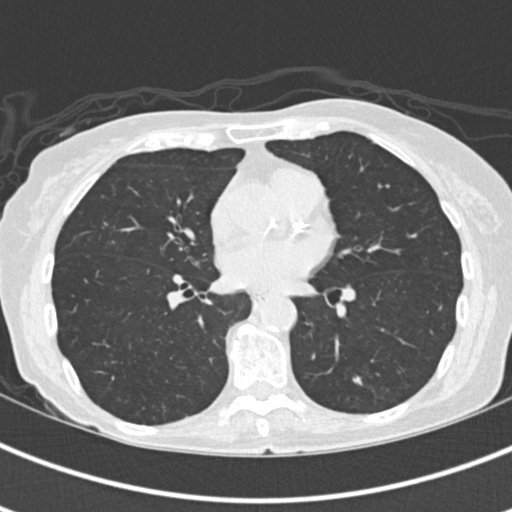
[im 80/144  lung]
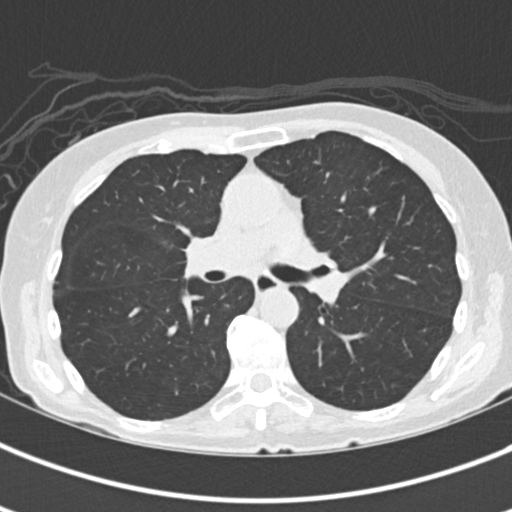
[im 91/144  lung]
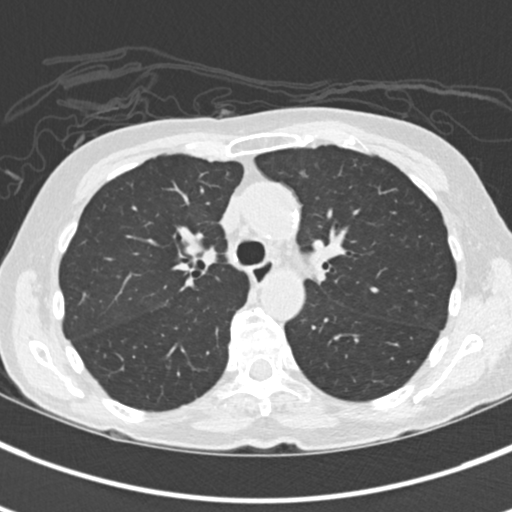
[im 101/144  mediastinal]
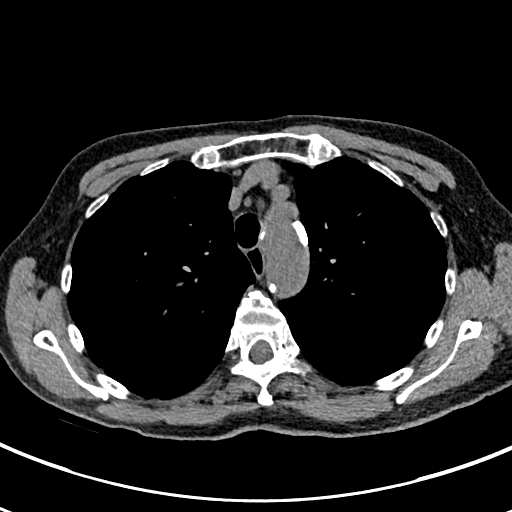
[im 101/144  lung]
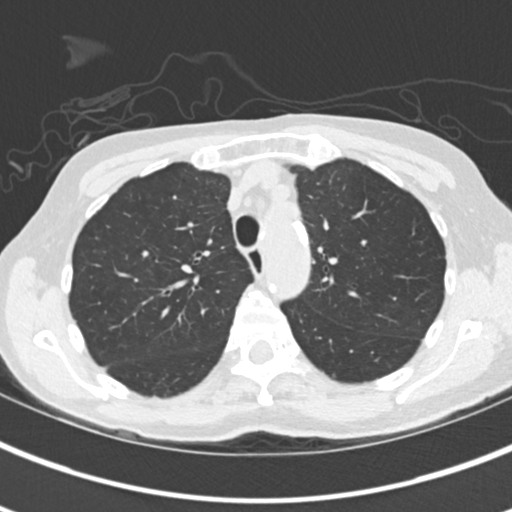
[im 112/144  lung]
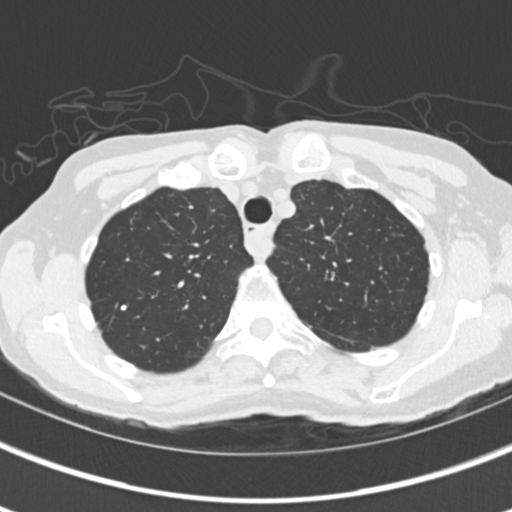
[im 122/144  lung]
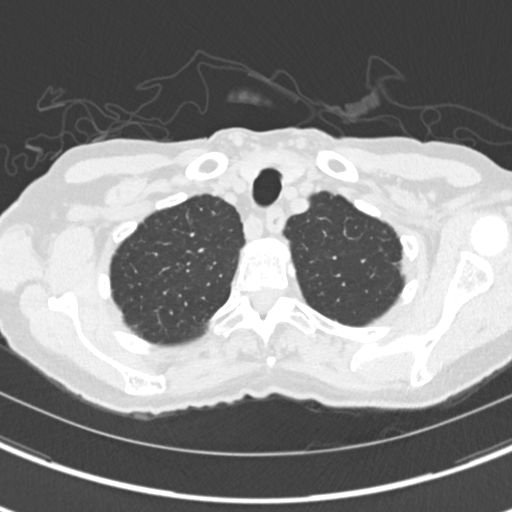
[im 133/144  lung]
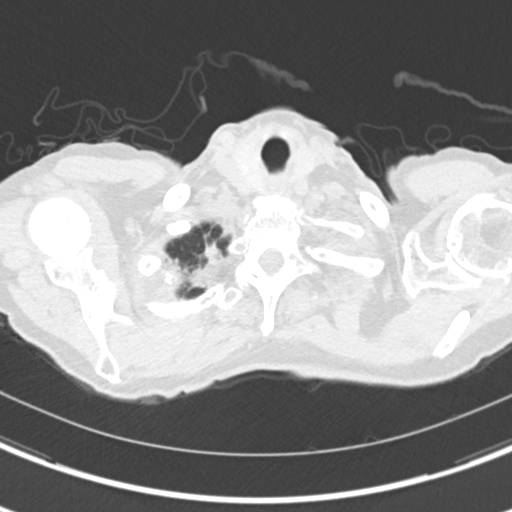

[Series 5: coronal · coronal · 0.59mm/px · 3 of 99 slices shown]
[im 20/99  lung]
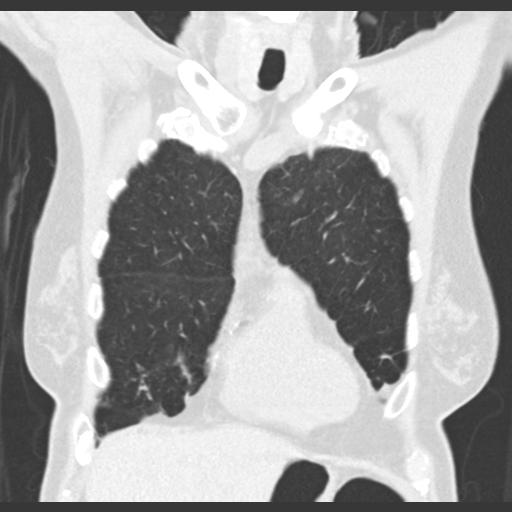
[im 40/99  lung]
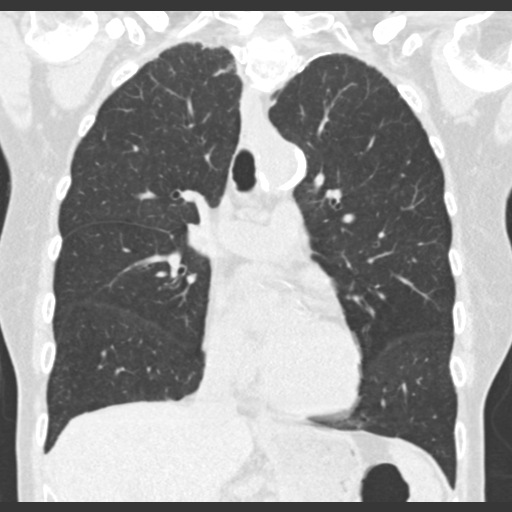
[im 59/99  lung]
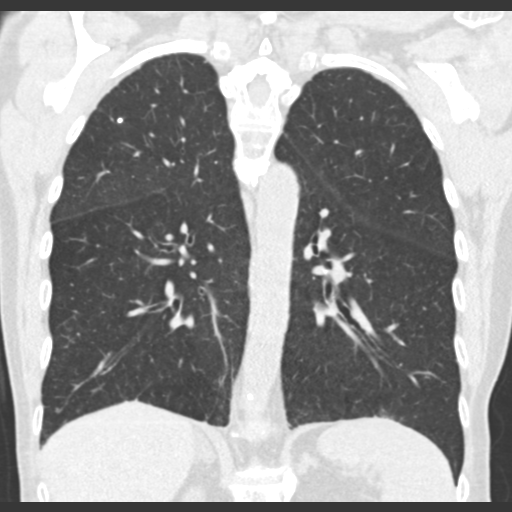

[15 of 36 positions shown; findings below may reference images not displayed]

FINDINGS: Cardiovascular: Normal heart size. No significant pericardial
fluid/thickening. Three-vessel coronary atherosclerosis.
Atherosclerotic nonaneurysmal thoracic aorta. Aberrant right
subclavian artery arising from the distal aortic arch with
retroesophageal course with mild proximal ectasia (17 mm diameter).
Normal caliber pulmonary arteries.

Mediastinum/Nodes: No discrete thyroid nodules. Unremarkable
esophagus. No pathologically enlarged axillary, mediastinal or gross
hilar lymph nodes, noting limited sensitivity for the detection of
hilar adenopathy on this noncontrast study.

Lungs/Pleura: No pneumothorax. No pleural effusion. No acute
consolidative airspace disease or lung masses. There is
mild-to-moderate cylindrical and varicoid bronchiectasis throughout
both lungs, most prominent in right middle lobe, lingula and basilar
right lower lobe. There is associated scattered mucoid impaction,
centrilobular micronodularity and mild patchy tree-in-bud opacity
throughout the areas of bronchiectasis. The previously described
dominant 5 mm nodular opacity in the medial basilar right lower lobe
(series 4/image 120) is stable and represents mucoid impaction.

Upper abdomen: No acute abnormality.

Musculoskeletal: No aggressive appearing focal osseous lesions.
Marked thoracic spondylosis.
IMPRESSION: 1. No findings suspicious for metastatic disease in the chest.
2. Spectrum of findings compatible with chronic infectious
bronchiolitis due to atypical mycobacterial infection ([place]),
including mild-to-moderate cylindrical and varicoid bronchiectasis,
scattered mucoid impaction and mild tree-in-bud opacity, most
prominent in the mid to lower lungs. Previously described nodule in
the medial basilar right lower lobe is stable and represents mucoid
impaction.
3. Three-vessel coronary atherosclerosis.
4. Aberrant right subclavian artery.

Aortic Atherosclerosis ([id number]).

## 2018-09-22 DIAGNOSIS — R109 Unspecified abdominal pain: Secondary | ICD-10-CM | POA: Diagnosis not present

## 2018-09-22 DIAGNOSIS — Z299 Encounter for prophylactic measures, unspecified: Secondary | ICD-10-CM | POA: Diagnosis not present

## 2018-09-22 DIAGNOSIS — I10 Essential (primary) hypertension: Secondary | ICD-10-CM | POA: Diagnosis not present

## 2018-09-22 DIAGNOSIS — Z713 Dietary counseling and surveillance: Secondary | ICD-10-CM | POA: Diagnosis not present

## 2018-09-22 DIAGNOSIS — Z6820 Body mass index (BMI) 20.0-20.9, adult: Secondary | ICD-10-CM | POA: Diagnosis not present

## 2018-10-02 ENCOUNTER — Encounter: Payer: Self-pay | Admitting: Radiation Oncology

## 2018-10-02 ENCOUNTER — Other Ambulatory Visit: Payer: Self-pay

## 2018-10-02 ENCOUNTER — Ambulatory Visit
Admission: RE | Admit: 2018-10-02 | Discharge: 2018-10-02 | Disposition: A | Discharge status: Home / Self Care | Payer: Medicare Other | Source: Ambulatory Visit | Attending: Radiation Oncology | Admitting: Radiation Oncology

## 2018-10-02 VITALS — BP 142/58 | HR 80 | Temp 98.3°F | Resp 18 | Ht 63.0 in | Wt 117.1 lb

## 2018-10-02 DIAGNOSIS — Z08 Encounter for follow-up examination after completed treatment for malignant neoplasm: Secondary | ICD-10-CM | POA: Diagnosis not present

## 2018-10-02 DIAGNOSIS — Z79899 Other long term (current) drug therapy: Secondary | ICD-10-CM | POA: Insufficient documentation

## 2018-10-02 DIAGNOSIS — Z923 Personal history of irradiation: Secondary | ICD-10-CM | POA: Diagnosis not present

## 2018-10-02 DIAGNOSIS — Z7982 Long term (current) use of aspirin: Secondary | ICD-10-CM | POA: Diagnosis not present

## 2018-10-02 DIAGNOSIS — Z8542 Personal history of malignant neoplasm of other parts of uterus: Secondary | ICD-10-CM | POA: Diagnosis not present

## 2018-10-02 DIAGNOSIS — C549 Malignant neoplasm of corpus uteri, unspecified: Secondary | ICD-10-CM

## 2018-10-02 NOTE — Progress Notes (Signed)
Pt presents today for f/u appt with Dr. Sondra Come. Pt is accompanied by son. Pt denies dysuria/hematuria. Pt denies vaginal bleeding/discharge. Pt denies rectal bleeding, diarrhea. Pt reports that constipation is controlled with fluid intake and exercise. Pt reports recent sensation of "a pit in stomach". Pt reports PCP diagnosed sensation as GERD and has started Dexilant. Pt denies abdominal bloating but does report "having gas".   BP (!) 142/58 (BP Location: Left Arm, Patient Position: Sitting)   Pulse 80   Temp 98.3 F (36.8 C) (Oral)   Resp 18   Ht 5\' 3"  (1.6 m)   Wt 117 lb 2 oz (53.1 kg)   SpO2 98%   BMI 20.75 kg/m   Wt Readings from Last 3 Encounters:  10/02/18 117 lb 2 oz (53.1 kg)  03/31/18 109 lb 4 oz (49.6 kg)  09/30/17 111 lb 12.8 oz (50.7 kg)    Loma Sousa, RN BSN

## 2018-10-02 NOTE — Progress Notes (Signed)
Radiation Oncology         (336) [phone] ________________________________  Name: Pamela Bright MRN: [id number]  Date: 10/02/2018  DOB: 1936/03/10   Follow-Up Visit Note  CC: Monico Blitz, MD  Gatha Mayer, MD    ICD-10-CM   1. Malignant neoplasm of corpus uteri, except isthmus [facility]) C54.9     Diagnosis: Endometrioid adenocarcinoma, Stage pT1b, pN0, Mx   Interval Since Last Radiation: 4 years and 7 months 02/01/14 - 03/02/14: 30 Gy to the proximal vagina in 5 fractions  Narrative:  The patient returns today for routine follow-up for radiation treatment completed 03/02/14. She is accompanied by her son. Recent chest CT scan on 04/21/18 showed no findings suspicious for metastatic disease.   She denies dysuria or hematuria, vaginal bleeding or discharge, rectal bleeding, diarrhea and abdominal bloating. She reports being recently diagnosed with GERD, and notes feeling "a pit in her stomach" and having gas. She reports constipation that is controlled with fluids and exercise.    ALLERGIES:  is allergic to ace inhibitors and theophyllines.  Meds: Current Outpatient Medications  Medication Sig Dispense Refill  . aspirin 325 MG tablet Take 325 mg by mouth daily.    . Cholecalciferol (VITAMIN D-3 PO) Take by mouth.    . dexlansoprazole (DEXILANT) 60 MG capsule Take 60 mg by mouth daily.    . Loratadine (CLARITIN) 10 MG CAPS Take by mouth.    . metoprolol succinate (TOPROL-XL) 50 MG 24 hr tablet Take 25 mg by mouth daily. Take with or immediately following a meal.    . Multiple Vitamins-Minerals (MULTIVITAMIN PO) Take by mouth.    . simvastatin (ZOCOR) 20 MG tablet Take 20 mg by mouth every evening.    . Calcium Citrate (CITRACAL PO) Take 250 mg by mouth once.     . Omega-3 Fatty Acids (FISH OIL BURP-LESS) 1200 MG CAPS Take by mouth.     No current facility-administered medications for this encounter.     Physical Findings: The patient is in no acute distress. Patient is alert and  oriented.  height is 5\' 3"  (1.6 m) and weight is 117 lb 2 oz (53.1 kg). Her oral temperature is 98.3 F (36.8 C). Her blood pressure is 142/58 (abnormal) and her pulse is 80. Her respiration is 18 and oxygen saturation is 98%.  Lungs are clear to auscultation bilaterally. Heart has regular rate and rhythm. No palpable cervical, supraclavicular, or axillary adenopathy. Abdomen soft, non-tender, normal bowel sounds. On pelvic examination the external genitalia were unremarkable. A speculum exam was performed. There are no mucosal lesions noted in the vaginal vault. On bimanual examination there were no pelvic masses appreciated.   Lab Findings: Lab Results  Component Value Date   WBC 7.6 09/30/2017   HGB 13.2 09/30/2017   HCT 39.3 09/30/2017   MCV 89.2 09/30/2017   PLT 229 09/30/2017    Radiographic Findings: No results found.  Impression: Endometrioid adenocarcinoma, Stage pT1b, pN0, Mx   No evidence of recurrence on clinical exam today.  Plan:  Follow-up in radiation oncology in 6 months, which will be her last oncological follow up appointment. She will likely see Dr. Barrie Dunker for yearly exams afterward.  -----------------------------------  Blair Promise, PhD, MD  This document serves as a record of services personally performed by Gery Pray, MD. It was created on his behalf by Wilburn Mylar, a trained medical scribe. The creation of this record is based on the scribe's personal observations and the provider's statements to them.  This document has been checked and approved by the attending provider.

## 2018-10-03 DIAGNOSIS — Z7189 Other specified counseling: Secondary | ICD-10-CM | POA: Diagnosis not present

## 2018-10-03 DIAGNOSIS — Z0000 Encounter for general adult medical examination without abnormal findings: Secondary | ICD-10-CM | POA: Diagnosis not present

## 2018-10-03 DIAGNOSIS — I10 Essential (primary) hypertension: Secondary | ICD-10-CM | POA: Diagnosis not present

## 2018-10-03 DIAGNOSIS — Z6820 Body mass index (BMI) 20.0-20.9, adult: Secondary | ICD-10-CM | POA: Diagnosis not present

## 2018-10-03 DIAGNOSIS — Z299 Encounter for prophylactic measures, unspecified: Secondary | ICD-10-CM | POA: Diagnosis not present

## 2018-10-03 DIAGNOSIS — Z1339 Encounter for screening examination for other mental health and behavioral disorders: Secondary | ICD-10-CM | POA: Diagnosis not present

## 2018-10-03 DIAGNOSIS — Z1331 Encounter for screening for depression: Secondary | ICD-10-CM | POA: Diagnosis not present

## 2018-10-03 DIAGNOSIS — Z1211 Encounter for screening for malignant neoplasm of colon: Secondary | ICD-10-CM | POA: Diagnosis not present

## 2018-10-07 ENCOUNTER — Telehealth: Payer: Self-pay | Admitting: *Deleted

## 2018-10-07 ENCOUNTER — Telehealth: Payer: Self-pay

## 2018-10-07 DIAGNOSIS — E559 Vitamin D deficiency, unspecified: Secondary | ICD-10-CM | POA: Diagnosis not present

## 2018-10-07 DIAGNOSIS — Z79899 Other long term (current) drug therapy: Secondary | ICD-10-CM | POA: Diagnosis not present

## 2018-10-07 DIAGNOSIS — R5383 Other fatigue: Secondary | ICD-10-CM | POA: Diagnosis not present

## 2018-10-07 DIAGNOSIS — E7800 Pure hypercholesterolemia, unspecified: Secondary | ICD-10-CM | POA: Diagnosis not present

## 2018-10-07 NOTE — Telephone Encounter (Signed)
CALLED PATIENT TO INFORM OF FU APPT. FOR 04-06-19  @ 4 PM, SPOKE WITH PATIENT AND SHE IS AWARE OF THIS APPT.

## 2018-10-07 NOTE — Telephone Encounter (Signed)
Contacted pt to convey good pap results per Dr. Sondra Come. Pt verbalized understanding and agreement with no further questions/concerns. Loma Sousa, RN BSN

## 2018-10-14 DIAGNOSIS — D1801 Hemangioma of skin and subcutaneous tissue: Secondary | ICD-10-CM | POA: Diagnosis not present

## 2018-10-14 DIAGNOSIS — L821 Other seborrheic keratosis: Secondary | ICD-10-CM | POA: Diagnosis not present

## 2018-10-14 DIAGNOSIS — D225 Melanocytic nevi of trunk: Secondary | ICD-10-CM | POA: Diagnosis not present

## 2018-10-14 DIAGNOSIS — L820 Inflamed seborrheic keratosis: Secondary | ICD-10-CM | POA: Diagnosis not present

## 2019-04-06 ENCOUNTER — Ambulatory Visit: Payer: Self-pay | Admitting: Radiation Oncology

## 2019-07-06 DIAGNOSIS — I10 Essential (primary) hypertension: Secondary | ICD-10-CM | POA: Diagnosis not present

## 2019-07-06 DIAGNOSIS — C541 Malignant neoplasm of endometrium: Secondary | ICD-10-CM | POA: Diagnosis not present

## 2019-07-06 DIAGNOSIS — R350 Frequency of micturition: Secondary | ICD-10-CM | POA: Diagnosis not present

## 2019-07-06 DIAGNOSIS — I429 Cardiomyopathy, unspecified: Secondary | ICD-10-CM | POA: Diagnosis not present

## 2019-07-06 DIAGNOSIS — Z6820 Body mass index (BMI) 20.0-20.9, adult: Secondary | ICD-10-CM | POA: Diagnosis not present

## 2019-07-06 DIAGNOSIS — Z789 Other specified health status: Secondary | ICD-10-CM | POA: Diagnosis not present

## 2019-07-06 DIAGNOSIS — N390 Urinary tract infection, site not specified: Secondary | ICD-10-CM | POA: Diagnosis not present

## 2019-07-06 DIAGNOSIS — Z299 Encounter for prophylactic measures, unspecified: Secondary | ICD-10-CM | POA: Diagnosis not present

## 2019-08-07 DIAGNOSIS — M79675 Pain in left toe(s): Secondary | ICD-10-CM | POA: Diagnosis not present

## 2019-08-07 DIAGNOSIS — L03032 Cellulitis of left toe: Secondary | ICD-10-CM | POA: Diagnosis not present

## 2019-08-21 DIAGNOSIS — L03032 Cellulitis of left toe: Secondary | ICD-10-CM | POA: Diagnosis not present

## 2019-08-21 DIAGNOSIS — M79675 Pain in left toe(s): Secondary | ICD-10-CM | POA: Diagnosis not present

## 2019-09-14 DIAGNOSIS — Z23 Encounter for immunization: Secondary | ICD-10-CM | POA: Diagnosis not present

## 2019-09-17 DIAGNOSIS — Z01411 Encounter for gynecological examination (general) (routine) with abnormal findings: Secondary | ICD-10-CM | POA: Diagnosis not present

## 2019-09-17 DIAGNOSIS — C541 Malignant neoplasm of endometrium: Secondary | ICD-10-CM | POA: Diagnosis not present

## 2019-10-20 DIAGNOSIS — Z1331 Encounter for screening for depression: Secondary | ICD-10-CM | POA: Diagnosis not present

## 2019-10-20 DIAGNOSIS — Z1339 Encounter for screening examination for other mental health and behavioral disorders: Secondary | ICD-10-CM | POA: Diagnosis not present

## 2019-10-20 DIAGNOSIS — Z0000 Encounter for general adult medical examination without abnormal findings: Secondary | ICD-10-CM | POA: Diagnosis not present

## 2019-10-20 DIAGNOSIS — E7800 Pure hypercholesterolemia, unspecified: Secondary | ICD-10-CM | POA: Diagnosis not present

## 2019-10-20 DIAGNOSIS — R5383 Other fatigue: Secondary | ICD-10-CM | POA: Diagnosis not present

## 2019-10-20 DIAGNOSIS — Z299 Encounter for prophylactic measures, unspecified: Secondary | ICD-10-CM | POA: Diagnosis not present

## 2019-10-20 DIAGNOSIS — Z7189 Other specified counseling: Secondary | ICD-10-CM | POA: Diagnosis not present

## 2019-10-20 DIAGNOSIS — Z6820 Body mass index (BMI) 20.0-20.9, adult: Secondary | ICD-10-CM | POA: Diagnosis not present

## 2019-10-20 DIAGNOSIS — I10 Essential (primary) hypertension: Secondary | ICD-10-CM | POA: Diagnosis not present

## 2019-10-20 DIAGNOSIS — C541 Malignant neoplasm of endometrium: Secondary | ICD-10-CM | POA: Diagnosis not present

## 2019-10-20 DIAGNOSIS — Z79899 Other long term (current) drug therapy: Secondary | ICD-10-CM | POA: Diagnosis not present

## 2019-10-20 DIAGNOSIS — Z1211 Encounter for screening for malignant neoplasm of colon: Secondary | ICD-10-CM | POA: Diagnosis not present

## 2020-01-12 ENCOUNTER — Telehealth: Payer: Self-pay | Admitting: *Deleted

## 2020-01-12 NOTE — Telephone Encounter (Signed)
Called patient to inform that she can take the COVID vaccine, lvm for a return call

## 2020-01-12 NOTE — Telephone Encounter (Signed)
RETURNED PATIENT'S PHONE CALL, LVM FOR A RETURN CALL 

## 2020-02-17 DIAGNOSIS — Z23 Encounter for immunization: Secondary | ICD-10-CM | POA: Diagnosis not present

## 2020-03-16 DIAGNOSIS — Z23 Encounter for immunization: Secondary | ICD-10-CM | POA: Diagnosis not present

## 2020-04-20 DIAGNOSIS — S00419A Abrasion of unspecified ear, initial encounter: Secondary | ICD-10-CM | POA: Diagnosis not present

## 2020-04-20 DIAGNOSIS — Z299 Encounter for prophylactic measures, unspecified: Secondary | ICD-10-CM | POA: Diagnosis not present

## 2020-04-20 DIAGNOSIS — I10 Essential (primary) hypertension: Secondary | ICD-10-CM | POA: Diagnosis not present

## 2020-04-20 DIAGNOSIS — C541 Malignant neoplasm of endometrium: Secondary | ICD-10-CM | POA: Diagnosis not present

## 2020-04-20 DIAGNOSIS — I429 Cardiomyopathy, unspecified: Secondary | ICD-10-CM | POA: Diagnosis not present

## 2020-04-20 DIAGNOSIS — Z789 Other specified health status: Secondary | ICD-10-CM | POA: Diagnosis not present

## 2020-05-19 ENCOUNTER — Telehealth: Payer: Self-pay

## 2020-05-27 NOTE — Telephone Encounter (Signed)
Date/Time Type Contact Phone/Fax         05/19/2020 09:49 AM Phone (Incoming) Redmond Pulling, [place] Campbell D (Self) [phone] (H)   Pt. called and LVM that she had her 2 Covid vaccines on 02/17/20 and 03/16/20. Reports they were Moderna vaccines.    By De Burrs, RN

## 2020-08-24 DIAGNOSIS — H2510 Age-related nuclear cataract, unspecified eye: Secondary | ICD-10-CM | POA: Diagnosis not present

## 2020-09-14 DIAGNOSIS — I10 Essential (primary) hypertension: Secondary | ICD-10-CM | POA: Diagnosis not present

## 2020-09-14 DIAGNOSIS — Z299 Encounter for prophylactic measures, unspecified: Secondary | ICD-10-CM | POA: Diagnosis not present

## 2020-09-14 DIAGNOSIS — R21 Rash and other nonspecific skin eruption: Secondary | ICD-10-CM | POA: Diagnosis not present

## 2020-09-14 DIAGNOSIS — I429 Cardiomyopathy, unspecified: Secondary | ICD-10-CM | POA: Diagnosis not present

## 2020-09-14 DIAGNOSIS — C541 Malignant neoplasm of endometrium: Secondary | ICD-10-CM | POA: Diagnosis not present

## 2020-09-29 DIAGNOSIS — R300 Dysuria: Secondary | ICD-10-CM | POA: Diagnosis not present

## 2020-09-29 DIAGNOSIS — Z01419 Encounter for gynecological examination (general) (routine) without abnormal findings: Secondary | ICD-10-CM | POA: Diagnosis not present

## 2020-10-21 DIAGNOSIS — Z23 Encounter for immunization: Secondary | ICD-10-CM | POA: Diagnosis not present

## 2020-11-02 DIAGNOSIS — Z7189 Other specified counseling: Secondary | ICD-10-CM | POA: Diagnosis not present

## 2020-11-02 DIAGNOSIS — Z299 Encounter for prophylactic measures, unspecified: Secondary | ICD-10-CM | POA: Diagnosis not present

## 2020-11-02 DIAGNOSIS — I10 Essential (primary) hypertension: Secondary | ICD-10-CM | POA: Diagnosis not present

## 2020-11-02 DIAGNOSIS — Z0000 Encounter for general adult medical examination without abnormal findings: Secondary | ICD-10-CM | POA: Diagnosis not present

## 2020-11-02 DIAGNOSIS — Z6820 Body mass index (BMI) 20.0-20.9, adult: Secondary | ICD-10-CM | POA: Diagnosis not present

## 2020-11-02 DIAGNOSIS — Z1331 Encounter for screening for depression: Secondary | ICD-10-CM | POA: Diagnosis not present

## 2020-11-02 DIAGNOSIS — E7800 Pure hypercholesterolemia, unspecified: Secondary | ICD-10-CM | POA: Diagnosis not present

## 2020-11-02 DIAGNOSIS — Z79899 Other long term (current) drug therapy: Secondary | ICD-10-CM | POA: Diagnosis not present

## 2020-11-02 DIAGNOSIS — R5383 Other fatigue: Secondary | ICD-10-CM | POA: Diagnosis not present

## 2020-11-02 DIAGNOSIS — Z1339 Encounter for screening examination for other mental health and behavioral disorders: Secondary | ICD-10-CM | POA: Diagnosis not present

## 2020-12-02 DIAGNOSIS — Z23 Encounter for immunization: Secondary | ICD-10-CM | POA: Diagnosis not present

## 2021-02-02 DIAGNOSIS — Z789 Other specified health status: Secondary | ICD-10-CM | POA: Diagnosis not present

## 2021-02-02 DIAGNOSIS — C541 Malignant neoplasm of endometrium: Secondary | ICD-10-CM | POA: Diagnosis not present

## 2021-02-02 DIAGNOSIS — Z299 Encounter for prophylactic measures, unspecified: Secondary | ICD-10-CM | POA: Diagnosis not present

## 2021-02-02 DIAGNOSIS — I10 Essential (primary) hypertension: Secondary | ICD-10-CM | POA: Diagnosis not present

## 2021-02-02 DIAGNOSIS — I429 Cardiomyopathy, unspecified: Secondary | ICD-10-CM | POA: Diagnosis not present

## 2021-02-02 DIAGNOSIS — K219 Gastro-esophageal reflux disease without esophagitis: Secondary | ICD-10-CM | POA: Diagnosis not present

## 2021-03-02 DIAGNOSIS — I10 Essential (primary) hypertension: Secondary | ICD-10-CM | POA: Diagnosis not present

## 2021-03-02 DIAGNOSIS — E7800 Pure hypercholesterolemia, unspecified: Secondary | ICD-10-CM | POA: Diagnosis not present

## 2021-03-02 DIAGNOSIS — Z299 Encounter for prophylactic measures, unspecified: Secondary | ICD-10-CM | POA: Diagnosis not present

## 2021-03-02 DIAGNOSIS — Z6820 Body mass index (BMI) 20.0-20.9, adult: Secondary | ICD-10-CM | POA: Diagnosis not present

## 2021-03-02 DIAGNOSIS — L509 Urticaria, unspecified: Secondary | ICD-10-CM | POA: Diagnosis not present

## 2021-03-02 DIAGNOSIS — Z789 Other specified health status: Secondary | ICD-10-CM | POA: Diagnosis not present

## 2021-03-02 DIAGNOSIS — M542 Cervicalgia: Secondary | ICD-10-CM | POA: Diagnosis not present

## 2021-05-02 DIAGNOSIS — Z789 Other specified health status: Secondary | ICD-10-CM | POA: Diagnosis not present

## 2021-05-02 DIAGNOSIS — U071 COVID-19: Secondary | ICD-10-CM | POA: Diagnosis not present

## 2021-05-02 DIAGNOSIS — I10 Essential (primary) hypertension: Secondary | ICD-10-CM | POA: Diagnosis not present

## 2021-05-02 DIAGNOSIS — Z299 Encounter for prophylactic measures, unspecified: Secondary | ICD-10-CM | POA: Diagnosis not present

## 2021-05-02 DIAGNOSIS — Z20822 Contact with and (suspected) exposure to covid-19: Secondary | ICD-10-CM | POA: Diagnosis not present

## 2021-05-02 DIAGNOSIS — C541 Malignant neoplasm of endometrium: Secondary | ICD-10-CM | POA: Diagnosis not present

## 2021-05-02 DIAGNOSIS — I429 Cardiomyopathy, unspecified: Secondary | ICD-10-CM | POA: Diagnosis not present

## 2021-05-02 DIAGNOSIS — R0981 Nasal congestion: Secondary | ICD-10-CM | POA: Diagnosis not present

## 2021-05-02 DIAGNOSIS — J329 Chronic sinusitis, unspecified: Secondary | ICD-10-CM | POA: Diagnosis not present

## 2021-05-16 DIAGNOSIS — R059 Cough, unspecified: Secondary | ICD-10-CM | POA: Diagnosis not present

## 2021-05-16 DIAGNOSIS — I700 Atherosclerosis of aorta: Secondary | ICD-10-CM | POA: Diagnosis not present

## 2021-05-16 DIAGNOSIS — I10 Essential (primary) hypertension: Secondary | ICD-10-CM | POA: Diagnosis not present

## 2021-05-16 DIAGNOSIS — J069 Acute upper respiratory infection, unspecified: Secondary | ICD-10-CM | POA: Diagnosis not present

## 2021-05-16 DIAGNOSIS — Z789 Other specified health status: Secondary | ICD-10-CM | POA: Diagnosis not present

## 2021-05-16 DIAGNOSIS — Z299 Encounter for prophylactic measures, unspecified: Secondary | ICD-10-CM | POA: Diagnosis not present

## 2021-08-25 DIAGNOSIS — H2510 Age-related nuclear cataract, unspecified eye: Secondary | ICD-10-CM | POA: Diagnosis not present

## 2021-09-29 DIAGNOSIS — Z23 Encounter for immunization: Secondary | ICD-10-CM | POA: Diagnosis not present

## 2021-11-07 DIAGNOSIS — Z6821 Body mass index (BMI) 21.0-21.9, adult: Secondary | ICD-10-CM | POA: Diagnosis not present

## 2021-11-07 DIAGNOSIS — I429 Cardiomyopathy, unspecified: Secondary | ICD-10-CM | POA: Diagnosis not present

## 2021-11-07 DIAGNOSIS — Z7189 Other specified counseling: Secondary | ICD-10-CM | POA: Diagnosis not present

## 2021-11-07 DIAGNOSIS — Z299 Encounter for prophylactic measures, unspecified: Secondary | ICD-10-CM | POA: Diagnosis not present

## 2021-11-07 DIAGNOSIS — Z1339 Encounter for screening examination for other mental health and behavioral disorders: Secondary | ICD-10-CM | POA: Diagnosis not present

## 2021-11-07 DIAGNOSIS — Z0000 Encounter for general adult medical examination without abnormal findings: Secondary | ICD-10-CM | POA: Diagnosis not present

## 2021-11-07 DIAGNOSIS — Z1331 Encounter for screening for depression: Secondary | ICD-10-CM | POA: Diagnosis not present

## 2021-11-07 DIAGNOSIS — Z2821 Immunization not carried out because of patient refusal: Secondary | ICD-10-CM | POA: Diagnosis not present

## 2021-11-07 DIAGNOSIS — Z79899 Other long term (current) drug therapy: Secondary | ICD-10-CM | POA: Diagnosis not present

## 2021-11-07 DIAGNOSIS — R5383 Other fatigue: Secondary | ICD-10-CM | POA: Diagnosis not present

## 2021-11-07 DIAGNOSIS — E7800 Pure hypercholesterolemia, unspecified: Secondary | ICD-10-CM | POA: Diagnosis not present

## 2021-11-07 DIAGNOSIS — I10 Essential (primary) hypertension: Secondary | ICD-10-CM | POA: Diagnosis not present

## 2021-11-09 DIAGNOSIS — Z23 Encounter for immunization: Secondary | ICD-10-CM | POA: Diagnosis not present

## 2022-02-21 DIAGNOSIS — R040 Epistaxis: Secondary | ICD-10-CM | POA: Diagnosis not present

## 2022-02-21 DIAGNOSIS — H6121 Impacted cerumen, right ear: Secondary | ICD-10-CM | POA: Diagnosis not present

## 2022-03-01 DIAGNOSIS — Z789 Other specified health status: Secondary | ICD-10-CM | POA: Diagnosis not present

## 2022-03-01 DIAGNOSIS — I700 Atherosclerosis of aorta: Secondary | ICD-10-CM | POA: Diagnosis not present

## 2022-03-01 DIAGNOSIS — Z299 Encounter for prophylactic measures, unspecified: Secondary | ICD-10-CM | POA: Diagnosis not present

## 2022-03-01 DIAGNOSIS — I429 Cardiomyopathy, unspecified: Secondary | ICD-10-CM | POA: Diagnosis not present

## 2022-03-01 DIAGNOSIS — I10 Essential (primary) hypertension: Secondary | ICD-10-CM | POA: Diagnosis not present

## 2022-06-06 DIAGNOSIS — Z299 Encounter for prophylactic measures, unspecified: Secondary | ICD-10-CM | POA: Diagnosis not present

## 2022-06-06 DIAGNOSIS — I10 Essential (primary) hypertension: Secondary | ICD-10-CM | POA: Diagnosis not present

## 2022-06-06 DIAGNOSIS — I700 Atherosclerosis of aorta: Secondary | ICD-10-CM | POA: Diagnosis not present

## 2022-06-06 DIAGNOSIS — I429 Cardiomyopathy, unspecified: Secondary | ICD-10-CM | POA: Diagnosis not present

## 2022-06-06 DIAGNOSIS — Z789 Other specified health status: Secondary | ICD-10-CM | POA: Diagnosis not present

## 2022-07-02 DIAGNOSIS — W57XXXA Bitten or stung by nonvenomous insect and other nonvenomous arthropods, initial encounter: Secondary | ICD-10-CM | POA: Diagnosis not present

## 2022-07-02 DIAGNOSIS — I10 Essential (primary) hypertension: Secondary | ICD-10-CM | POA: Diagnosis not present

## 2022-07-02 DIAGNOSIS — Z299 Encounter for prophylactic measures, unspecified: Secondary | ICD-10-CM | POA: Diagnosis not present

## 2022-07-12 DIAGNOSIS — Z6820 Body mass index (BMI) 20.0-20.9, adult: Secondary | ICD-10-CM | POA: Diagnosis not present

## 2022-07-12 DIAGNOSIS — Z299 Encounter for prophylactic measures, unspecified: Secondary | ICD-10-CM | POA: Diagnosis not present

## 2022-07-12 DIAGNOSIS — W57XXXA Bitten or stung by nonvenomous insect and other nonvenomous arthropods, initial encounter: Secondary | ICD-10-CM | POA: Diagnosis not present

## 2022-07-12 DIAGNOSIS — R2241 Localized swelling, mass and lump, right lower limb: Secondary | ICD-10-CM | POA: Diagnosis not present

## 2022-07-12 DIAGNOSIS — L539 Erythematous condition, unspecified: Secondary | ICD-10-CM | POA: Diagnosis not present

## 2022-09-07 DIAGNOSIS — L03032 Cellulitis of left toe: Secondary | ICD-10-CM | POA: Diagnosis not present

## 2022-09-07 DIAGNOSIS — M79675 Pain in left toe(s): Secondary | ICD-10-CM | POA: Diagnosis not present

## 2022-09-12 DIAGNOSIS — Z299 Encounter for prophylactic measures, unspecified: Secondary | ICD-10-CM | POA: Diagnosis not present

## 2022-09-12 DIAGNOSIS — I10 Essential (primary) hypertension: Secondary | ICD-10-CM | POA: Diagnosis not present

## 2022-09-12 DIAGNOSIS — Z789 Other specified health status: Secondary | ICD-10-CM | POA: Diagnosis not present

## 2022-10-03 DIAGNOSIS — Z23 Encounter for immunization: Secondary | ICD-10-CM | POA: Diagnosis not present

## 2022-10-19 DIAGNOSIS — Z299 Encounter for prophylactic measures, unspecified: Secondary | ICD-10-CM | POA: Diagnosis not present

## 2022-10-19 DIAGNOSIS — Z789 Other specified health status: Secondary | ICD-10-CM | POA: Diagnosis not present

## 2022-10-19 DIAGNOSIS — M25561 Pain in right knee: Secondary | ICD-10-CM | POA: Diagnosis not present

## 2022-10-19 DIAGNOSIS — I10 Essential (primary) hypertension: Secondary | ICD-10-CM | POA: Diagnosis not present

## 2022-11-19 DIAGNOSIS — M79676 Pain in unspecified toe(s): Secondary | ICD-10-CM | POA: Diagnosis not present

## 2022-11-19 DIAGNOSIS — B351 Tinea unguium: Secondary | ICD-10-CM | POA: Diagnosis not present

## 2022-11-21 DIAGNOSIS — Z79899 Other long term (current) drug therapy: Secondary | ICD-10-CM | POA: Diagnosis not present

## 2022-11-21 DIAGNOSIS — I10 Essential (primary) hypertension: Secondary | ICD-10-CM | POA: Diagnosis not present

## 2022-11-21 DIAGNOSIS — R5383 Other fatigue: Secondary | ICD-10-CM | POA: Diagnosis not present

## 2022-11-21 DIAGNOSIS — Z1331 Encounter for screening for depression: Secondary | ICD-10-CM | POA: Diagnosis not present

## 2022-11-21 DIAGNOSIS — E559 Vitamin D deficiency, unspecified: Secondary | ICD-10-CM | POA: Diagnosis not present

## 2022-11-21 DIAGNOSIS — E7800 Pure hypercholesterolemia, unspecified: Secondary | ICD-10-CM | POA: Diagnosis not present

## 2022-11-21 DIAGNOSIS — Z6820 Body mass index (BMI) 20.0-20.9, adult: Secondary | ICD-10-CM | POA: Diagnosis not present

## 2022-11-21 DIAGNOSIS — Z299 Encounter for prophylactic measures, unspecified: Secondary | ICD-10-CM | POA: Diagnosis not present

## 2022-11-21 DIAGNOSIS — Z1339 Encounter for screening examination for other mental health and behavioral disorders: Secondary | ICD-10-CM | POA: Diagnosis not present

## 2022-11-21 DIAGNOSIS — Z0000 Encounter for general adult medical examination without abnormal findings: Secondary | ICD-10-CM | POA: Diagnosis not present

## 2022-11-21 DIAGNOSIS — Z7189 Other specified counseling: Secondary | ICD-10-CM | POA: Diagnosis not present

## 2022-11-21 DIAGNOSIS — Z789 Other specified health status: Secondary | ICD-10-CM | POA: Diagnosis not present

## 2023-02-08 DIAGNOSIS — M79676 Pain in unspecified toe(s): Secondary | ICD-10-CM | POA: Diagnosis not present

## 2023-02-08 DIAGNOSIS — B351 Tinea unguium: Secondary | ICD-10-CM | POA: Diagnosis not present

## 2023-03-26 DIAGNOSIS — I10 Essential (primary) hypertension: Secondary | ICD-10-CM | POA: Diagnosis not present

## 2023-03-26 DIAGNOSIS — K219 Gastro-esophageal reflux disease without esophagitis: Secondary | ICD-10-CM | POA: Diagnosis not present

## 2023-03-26 DIAGNOSIS — I700 Atherosclerosis of aorta: Secondary | ICD-10-CM | POA: Diagnosis not present

## 2023-03-26 DIAGNOSIS — I429 Cardiomyopathy, unspecified: Secondary | ICD-10-CM | POA: Diagnosis not present

## 2023-03-26 DIAGNOSIS — Z299 Encounter for prophylactic measures, unspecified: Secondary | ICD-10-CM | POA: Diagnosis not present

## 2023-04-19 DIAGNOSIS — B351 Tinea unguium: Secondary | ICD-10-CM | POA: Diagnosis not present

## 2023-04-19 DIAGNOSIS — M79676 Pain in unspecified toe(s): Secondary | ICD-10-CM | POA: Diagnosis not present

## 2023-06-12 DIAGNOSIS — I10 Essential (primary) hypertension: Secondary | ICD-10-CM | POA: Diagnosis not present

## 2023-06-12 DIAGNOSIS — Z7982 Long term (current) use of aspirin: Secondary | ICD-10-CM | POA: Diagnosis not present

## 2023-06-12 DIAGNOSIS — K6389 Other specified diseases of intestine: Secondary | ICD-10-CM | POA: Diagnosis not present

## 2023-06-12 DIAGNOSIS — Z79899 Other long term (current) drug therapy: Secondary | ICD-10-CM | POA: Diagnosis not present

## 2023-06-12 DIAGNOSIS — E785 Hyperlipidemia, unspecified: Secondary | ICD-10-CM | POA: Diagnosis not present

## 2023-06-12 DIAGNOSIS — Z7902 Long term (current) use of antithrombotics/antiplatelets: Secondary | ICD-10-CM | POA: Diagnosis not present

## 2023-06-12 DIAGNOSIS — K5900 Constipation, unspecified: Secondary | ICD-10-CM | POA: Diagnosis not present

## 2023-06-26 DIAGNOSIS — Z299 Encounter for prophylactic measures, unspecified: Secondary | ICD-10-CM | POA: Diagnosis not present

## 2023-06-26 DIAGNOSIS — I10 Essential (primary) hypertension: Secondary | ICD-10-CM | POA: Diagnosis not present

## 2023-06-26 DIAGNOSIS — K219 Gastro-esophageal reflux disease without esophagitis: Secondary | ICD-10-CM | POA: Diagnosis not present

## 2023-06-28 DIAGNOSIS — M79676 Pain in unspecified toe(s): Secondary | ICD-10-CM | POA: Diagnosis not present

## 2023-06-28 DIAGNOSIS — B351 Tinea unguium: Secondary | ICD-10-CM | POA: Diagnosis not present

## 2023-09-06 DIAGNOSIS — M79676 Pain in unspecified toe(s): Secondary | ICD-10-CM | POA: Diagnosis not present

## 2023-09-06 DIAGNOSIS — B351 Tinea unguium: Secondary | ICD-10-CM | POA: Diagnosis not present

## 2023-10-03 DIAGNOSIS — Z23 Encounter for immunization: Secondary | ICD-10-CM | POA: Diagnosis not present

## 2023-10-16 DIAGNOSIS — Z299 Encounter for prophylactic measures, unspecified: Secondary | ICD-10-CM | POA: Diagnosis not present

## 2023-10-16 DIAGNOSIS — J320 Chronic maxillary sinusitis: Secondary | ICD-10-CM | POA: Diagnosis not present

## 2023-10-16 DIAGNOSIS — I700 Atherosclerosis of aorta: Secondary | ICD-10-CM | POA: Diagnosis not present

## 2023-10-16 DIAGNOSIS — I429 Cardiomyopathy, unspecified: Secondary | ICD-10-CM | POA: Diagnosis not present

## 2023-10-31 DIAGNOSIS — Z1331 Encounter for screening for depression: Secondary | ICD-10-CM | POA: Diagnosis not present

## 2023-10-31 DIAGNOSIS — R5383 Other fatigue: Secondary | ICD-10-CM | POA: Diagnosis not present

## 2023-10-31 DIAGNOSIS — Z0000 Encounter for general adult medical examination without abnormal findings: Secondary | ICD-10-CM | POA: Diagnosis not present

## 2023-10-31 DIAGNOSIS — Z7189 Other specified counseling: Secondary | ICD-10-CM | POA: Diagnosis not present

## 2023-10-31 DIAGNOSIS — I10 Essential (primary) hypertension: Secondary | ICD-10-CM | POA: Diagnosis not present

## 2023-10-31 DIAGNOSIS — Z299 Encounter for prophylactic measures, unspecified: Secondary | ICD-10-CM | POA: Diagnosis not present

## 2023-10-31 DIAGNOSIS — Z1339 Encounter for screening examination for other mental health and behavioral disorders: Secondary | ICD-10-CM | POA: Diagnosis not present

## 2023-10-31 DIAGNOSIS — E7800 Pure hypercholesterolemia, unspecified: Secondary | ICD-10-CM | POA: Diagnosis not present

## 2023-10-31 DIAGNOSIS — M8580 Other specified disorders of bone density and structure, unspecified site: Secondary | ICD-10-CM | POA: Diagnosis not present

## 2023-10-31 DIAGNOSIS — Z79899 Other long term (current) drug therapy: Secondary | ICD-10-CM | POA: Diagnosis not present

## 2023-11-14 DIAGNOSIS — Z299 Encounter for prophylactic measures, unspecified: Secondary | ICD-10-CM | POA: Diagnosis not present

## 2023-11-14 DIAGNOSIS — I700 Atherosclerosis of aorta: Secondary | ICD-10-CM | POA: Diagnosis not present

## 2023-11-14 DIAGNOSIS — Z6821 Body mass index (BMI) 21.0-21.9, adult: Secondary | ICD-10-CM | POA: Diagnosis not present

## 2023-11-14 DIAGNOSIS — I10 Essential (primary) hypertension: Secondary | ICD-10-CM | POA: Diagnosis not present

## 2023-11-15 DIAGNOSIS — M79676 Pain in unspecified toe(s): Secondary | ICD-10-CM | POA: Diagnosis not present

## 2023-11-15 DIAGNOSIS — B351 Tinea unguium: Secondary | ICD-10-CM | POA: Diagnosis not present

## 2024-02-18 DIAGNOSIS — Z299 Encounter for prophylactic measures, unspecified: Secondary | ICD-10-CM | POA: Diagnosis not present

## 2024-02-18 DIAGNOSIS — I10 Essential (primary) hypertension: Secondary | ICD-10-CM | POA: Diagnosis not present

## 2024-02-18 DIAGNOSIS — I700 Atherosclerosis of aorta: Secondary | ICD-10-CM | POA: Diagnosis not present

## 2024-02-18 DIAGNOSIS — Z6821 Body mass index (BMI) 21.0-21.9, adult: Secondary | ICD-10-CM | POA: Diagnosis not present

## 2024-02-18 DIAGNOSIS — I429 Cardiomyopathy, unspecified: Secondary | ICD-10-CM | POA: Diagnosis not present

## 2024-05-20 DIAGNOSIS — I10 Essential (primary) hypertension: Secondary | ICD-10-CM | POA: Diagnosis not present

## 2024-05-20 DIAGNOSIS — Z299 Encounter for prophylactic measures, unspecified: Secondary | ICD-10-CM | POA: Diagnosis not present

## 2024-05-20 DIAGNOSIS — J019 Acute sinusitis, unspecified: Secondary | ICD-10-CM | POA: Diagnosis not present

## 2024-05-20 DIAGNOSIS — Z6821 Body mass index (BMI) 21.0-21.9, adult: Secondary | ICD-10-CM | POA: Diagnosis not present

## 2024-06-23 DIAGNOSIS — L600 Ingrowing nail: Secondary | ICD-10-CM | POA: Diagnosis not present

## 2024-06-23 DIAGNOSIS — I10 Essential (primary) hypertension: Secondary | ICD-10-CM | POA: Diagnosis not present

## 2024-06-23 DIAGNOSIS — Z299 Encounter for prophylactic measures, unspecified: Secondary | ICD-10-CM | POA: Diagnosis not present

## 2024-06-23 DIAGNOSIS — R52 Pain, unspecified: Secondary | ICD-10-CM | POA: Diagnosis not present

## 2024-07-09 DIAGNOSIS — M79675 Pain in left toe(s): Secondary | ICD-10-CM | POA: Diagnosis not present

## 2024-07-09 DIAGNOSIS — L600 Ingrowing nail: Secondary | ICD-10-CM | POA: Diagnosis not present

## 2024-07-09 DIAGNOSIS — M79674 Pain in right toe(s): Secondary | ICD-10-CM | POA: Diagnosis not present

## 2024-07-09 DIAGNOSIS — B351 Tinea unguium: Secondary | ICD-10-CM | POA: Diagnosis not present

## 2024-07-09 DIAGNOSIS — L608 Other nail disorders: Secondary | ICD-10-CM | POA: Diagnosis not present

## 2024-07-09 DIAGNOSIS — L602 Onychogryphosis: Secondary | ICD-10-CM | POA: Diagnosis not present

## 2024-07-16 DIAGNOSIS — Z79899 Other long term (current) drug therapy: Secondary | ICD-10-CM | POA: Diagnosis not present

## 2024-07-16 DIAGNOSIS — R739 Hyperglycemia, unspecified: Secondary | ICD-10-CM | POA: Diagnosis not present

## 2024-07-16 DIAGNOSIS — Z7982 Long term (current) use of aspirin: Secondary | ICD-10-CM | POA: Diagnosis not present

## 2024-07-16 DIAGNOSIS — I10 Essential (primary) hypertension: Secondary | ICD-10-CM | POA: Diagnosis not present

## 2024-07-16 DIAGNOSIS — Z8542 Personal history of malignant neoplasm of other parts of uterus: Secondary | ICD-10-CM | POA: Diagnosis not present

## 2024-07-16 DIAGNOSIS — R079 Chest pain, unspecified: Secondary | ICD-10-CM | POA: Diagnosis not present

## 2024-07-16 DIAGNOSIS — R918 Other nonspecific abnormal finding of lung field: Secondary | ICD-10-CM | POA: Diagnosis not present

## 2024-07-16 DIAGNOSIS — Z888 Allergy status to other drugs, medicaments and biological substances status: Secondary | ICD-10-CM | POA: Diagnosis not present

## 2024-07-16 DIAGNOSIS — E785 Hyperlipidemia, unspecified: Secondary | ICD-10-CM | POA: Diagnosis not present

## 2024-07-21 DIAGNOSIS — Z299 Encounter for prophylactic measures, unspecified: Secondary | ICD-10-CM | POA: Diagnosis not present

## 2024-07-21 DIAGNOSIS — I10 Essential (primary) hypertension: Secondary | ICD-10-CM | POA: Diagnosis not present

## 2024-07-21 DIAGNOSIS — R079 Chest pain, unspecified: Secondary | ICD-10-CM | POA: Diagnosis not present

## 2024-07-21 DIAGNOSIS — Z7689 Persons encountering health services in other specified circumstances: Secondary | ICD-10-CM | POA: Diagnosis not present

## 2024-07-21 DIAGNOSIS — E119 Type 2 diabetes mellitus without complications: Secondary | ICD-10-CM | POA: Diagnosis not present

## 2024-07-21 DIAGNOSIS — R350 Frequency of micturition: Secondary | ICD-10-CM | POA: Diagnosis not present

## 2024-07-21 DIAGNOSIS — R3915 Urgency of urination: Secondary | ICD-10-CM | POA: Diagnosis not present

## 2024-07-23 DIAGNOSIS — I10 Essential (primary) hypertension: Secondary | ICD-10-CM | POA: Diagnosis not present

## 2024-07-23 DIAGNOSIS — K219 Gastro-esophageal reflux disease without esophagitis: Secondary | ICD-10-CM | POA: Diagnosis not present

## 2024-07-23 DIAGNOSIS — R072 Precordial pain: Secondary | ICD-10-CM | POA: Diagnosis not present

## 2024-07-23 DIAGNOSIS — E782 Mixed hyperlipidemia: Secondary | ICD-10-CM | POA: Diagnosis not present

## 2024-07-23 DIAGNOSIS — Z6820 Body mass index (BMI) 20.0-20.9, adult: Secondary | ICD-10-CM | POA: Diagnosis not present

## 2024-08-05 DIAGNOSIS — I10 Essential (primary) hypertension: Secondary | ICD-10-CM | POA: Diagnosis not present

## 2024-08-05 DIAGNOSIS — E785 Hyperlipidemia, unspecified: Secondary | ICD-10-CM | POA: Diagnosis not present

## 2024-08-05 DIAGNOSIS — R0789 Other chest pain: Secondary | ICD-10-CM | POA: Diagnosis not present

## 2024-08-13 DIAGNOSIS — R072 Precordial pain: Secondary | ICD-10-CM | POA: Diagnosis not present

## 2024-08-13 DIAGNOSIS — R9431 Abnormal electrocardiogram [ECG] [EKG]: Secondary | ICD-10-CM | POA: Diagnosis not present

## 2024-08-18 DIAGNOSIS — Z6820 Body mass index (BMI) 20.0-20.9, adult: Secondary | ICD-10-CM | POA: Diagnosis not present

## 2024-08-18 DIAGNOSIS — E119 Type 2 diabetes mellitus without complications: Secondary | ICD-10-CM | POA: Diagnosis not present

## 2024-08-18 DIAGNOSIS — I10 Essential (primary) hypertension: Secondary | ICD-10-CM | POA: Diagnosis not present

## 2024-08-18 DIAGNOSIS — Z299 Encounter for prophylactic measures, unspecified: Secondary | ICD-10-CM | POA: Diagnosis not present

## 2024-09-16 DIAGNOSIS — B351 Tinea unguium: Secondary | ICD-10-CM | POA: Diagnosis not present

## 2024-09-16 DIAGNOSIS — M79674 Pain in right toe(s): Secondary | ICD-10-CM | POA: Diagnosis not present

## 2024-09-16 DIAGNOSIS — M79675 Pain in left toe(s): Secondary | ICD-10-CM | POA: Diagnosis not present

## 2024-10-06 DIAGNOSIS — Z23 Encounter for immunization: Secondary | ICD-10-CM | POA: Diagnosis not present

## 2024-10-12 DIAGNOSIS — H2511 Age-related nuclear cataract, right eye: Secondary | ICD-10-CM | POA: Diagnosis not present

## 2024-10-27 DIAGNOSIS — Z1331 Encounter for screening for depression: Secondary | ICD-10-CM | POA: Diagnosis not present

## 2024-10-27 DIAGNOSIS — I10 Essential (primary) hypertension: Secondary | ICD-10-CM | POA: Diagnosis not present

## 2024-10-27 DIAGNOSIS — Z7189 Other specified counseling: Secondary | ICD-10-CM | POA: Diagnosis not present

## 2024-10-27 DIAGNOSIS — E7800 Pure hypercholesterolemia, unspecified: Secondary | ICD-10-CM | POA: Diagnosis not present

## 2024-10-27 DIAGNOSIS — Z299 Encounter for prophylactic measures, unspecified: Secondary | ICD-10-CM | POA: Diagnosis not present

## 2024-10-27 DIAGNOSIS — Z79899 Other long term (current) drug therapy: Secondary | ICD-10-CM | POA: Diagnosis not present

## 2024-10-27 DIAGNOSIS — Z0000 Encounter for general adult medical examination without abnormal findings: Secondary | ICD-10-CM | POA: Diagnosis not present

## 2024-10-27 DIAGNOSIS — Z1339 Encounter for screening examination for other mental health and behavioral disorders: Secondary | ICD-10-CM | POA: Diagnosis not present

## 2024-10-27 DIAGNOSIS — R5383 Other fatigue: Secondary | ICD-10-CM | POA: Diagnosis not present

## 2024-10-27 DIAGNOSIS — M8580 Other specified disorders of bone density and structure, unspecified site: Secondary | ICD-10-CM | POA: Diagnosis not present
